# Patient Record
Sex: Female | Born: 1980
Health system: Southern US, Community
[De-identification: ages and names within clinical notes are randomized; demographics above are authoritative.]

## PROBLEM LIST (undated history)

## (undated) DIAGNOSIS — L680 Hirsutism: Secondary | ICD-10-CM

## (undated) DIAGNOSIS — D219 Benign neoplasm of connective and other soft tissue, unspecified: Secondary | ICD-10-CM

## (undated) DIAGNOSIS — I10 Essential (primary) hypertension: Secondary | ICD-10-CM

## (undated) DIAGNOSIS — G510 Bell's palsy: Secondary | ICD-10-CM

## (undated) DIAGNOSIS — E669 Obesity, unspecified: Secondary | ICD-10-CM

## (undated) DIAGNOSIS — E282 Polycystic ovarian syndrome: Secondary | ICD-10-CM

## (undated) DIAGNOSIS — E221 Hyperprolactinemia: Secondary | ICD-10-CM

## (undated) DIAGNOSIS — O24419 Gestational diabetes mellitus in pregnancy, unspecified control: Secondary | ICD-10-CM

## (undated) HISTORY — DX: Hyperprolactinemia: E22.1

## (undated) HISTORY — DX: Hirsutism: L68.0

## (undated) HISTORY — DX: Benign neoplasm of connective and other soft tissue, unspecified: D21.9

## (undated) HISTORY — DX: Gestational diabetes mellitus in pregnancy, unspecified control: O24.419

## (undated) HISTORY — DX: Bell's palsy: G51.0

## (undated) HISTORY — DX: Obesity, unspecified: E66.9

---

## 1998-11-13 ENCOUNTER — Emergency Department (HOSPITAL_COMMUNITY): Admission: EM | Admit: 1998-11-13 | Discharge: 1998-11-13 | Payer: Self-pay | Admitting: Emergency Medicine

## 1998-11-13 ENCOUNTER — Encounter: Payer: Self-pay | Admitting: Emergency Medicine

## 2006-07-15 ENCOUNTER — Emergency Department (HOSPITAL_COMMUNITY): Admission: EM | Admit: 2006-07-15 | Discharge: 2006-07-15 | Payer: Self-pay | Admitting: Emergency Medicine

## 2006-09-21 ENCOUNTER — Emergency Department (HOSPITAL_COMMUNITY): Admission: EM | Admit: 2006-09-21 | Discharge: 2006-09-21 | Payer: Self-pay | Admitting: Emergency Medicine

## 2008-03-15 HISTORY — PX: COLPOSCOPY: SHX161

## 2008-04-08 ENCOUNTER — Ambulatory Visit (HOSPITAL_COMMUNITY): Admission: RE | Admit: 2008-04-08 | Discharge: 2008-04-08 | Payer: Self-pay | Admitting: Obstetrics and Gynecology

## 2008-04-08 ENCOUNTER — Encounter (INDEPENDENT_AMBULATORY_CARE_PROVIDER_SITE_OTHER): Payer: Self-pay | Admitting: Obstetrics and Gynecology

## 2008-04-08 HISTORY — PX: CERVICAL CONE BIOPSY: SUR198

## 2011-02-13 ENCOUNTER — Emergency Department (HOSPITAL_COMMUNITY)
Admission: EM | Admit: 2011-02-13 | Discharge: 2011-02-14 | Disposition: A | Discharge status: Home / Self Care | Payer: Self-pay | Attending: Emergency Medicine | Admitting: Emergency Medicine

## 2011-02-13 DIAGNOSIS — R300 Dysuria: Secondary | ICD-10-CM | POA: Insufficient documentation

## 2011-02-13 DIAGNOSIS — N390 Urinary tract infection, site not specified: Secondary | ICD-10-CM | POA: Insufficient documentation

## 2011-02-14 LAB — URINALYSIS, ROUTINE W REFLEX MICROSCOPIC
Bilirubin Urine: NEGATIVE
Glucose, UA: NEGATIVE mg/dL
Hgb urine dipstick: NEGATIVE
Ketones, ur: NEGATIVE mg/dL
Nitrite: NEGATIVE
Protein, ur: NEGATIVE mg/dL
Specific Gravity, Urine: 1.023 (ref 1.005–1.030)
Urobilinogen, UA: 1 mg/dL (ref 0.0–1.0)
pH: 8 (ref 5.0–8.0)

## 2011-02-14 LAB — URINE MICROSCOPIC-ADD ON

## 2011-02-14 LAB — POCT PREGNANCY, URINE: Preg Test, Ur: NEGATIVE

## 2011-03-19 NOTE — Op Note (Signed)
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   POSTOPERATIVE DIAGNOSIS:  Abnormal genital cytology probably  endocervical dysplasia.   OPERATION:  Cold-knife conization of the cervix.   SURGEON:  Vanessa P. Haygood, MD   ANESTHESIA:  General orotracheal.   ESTIMATED BLOOD LOSS:  Less than 10 mL.   COMPLICATIONS:  None.   FINDINGS:  There were no Lugol's stain, nonstaining areas on the  exocervix.   PROCEDURE:  The patient was taken to the operating room after  appropriate identification and placed on the operating table.  After the  attainment of adequate general anesthesia, she was placed in the  lithotomy position.  The perineum and vagina were prepped with multiple  layers of Betadine and draped as a sterile field.  A weighted speculum  was placed in the posterior vagina and a paracervical block achieved  with a total of 10 mL of 2% Xylocaine in the 5 and 7 o'clock positions.  The cervix was then infiltrated with a dilute solution of Pitressin.  Anchor sutures were placed at the 3 and 9 o'clock positions and tied  down.  The endocervical canal measured approximately 3 cm.  A cone  shaped specimen including the entire endocervical canal was then excised  and marked at the 12 o'clock position.  The conization bed was then  closed with Sturmdorf sutures and hemostasis noted to be adequate.  A  piece of Gelfoam was placed in the conization bed.  All sutures used  were 0 Vicryl.  All instruments were then removed from the vagina and  the patient awakened from general anesthesia and taken to the  recovery  room in satisfactory condition having tolerated the procedure well with  sponge and instrument counts correct.   SPECIMENS TO PATHOLOGY:  Cold-knife conization.      Hal Morales, M.D.  Electronically Signed     VPH/MEDQ  D:  04/08/2008  T:  04/08/2008  Job:  [id number]

## 2011-08-01 LAB — PREGNANCY, URINE: Preg Test, Ur: NEGATIVE

## 2011-08-01 LAB — CBC
MCHC: 34.9
Platelets: 281
RBC: 4.49
RDW: 12.7

## 2011-10-23 ENCOUNTER — Emergency Department (HOSPITAL_COMMUNITY): Payer: Self-pay

## 2011-10-23 ENCOUNTER — Encounter: Payer: Self-pay | Admitting: *Deleted

## 2011-10-23 ENCOUNTER — Emergency Department (HOSPITAL_COMMUNITY)
Admission: EM | Admit: 2011-10-23 | Discharge: 2011-10-23 | Disposition: A | Discharge status: Home / Self Care | Payer: Self-pay | Attending: Emergency Medicine | Admitting: Emergency Medicine

## 2011-10-23 DIAGNOSIS — M25562 Pain in left knee: Secondary | ICD-10-CM

## 2011-10-23 DIAGNOSIS — M25569 Pain in unspecified knee: Secondary | ICD-10-CM | POA: Insufficient documentation

## 2011-10-23 DIAGNOSIS — Y9269 Other specified industrial and construction area as the place of occurrence of the external cause: Secondary | ICD-10-CM | POA: Insufficient documentation

## 2011-10-23 DIAGNOSIS — W108XXA Fall (on) (from) other stairs and steps, initial encounter: Secondary | ICD-10-CM | POA: Insufficient documentation

## 2011-10-23 MED ORDER — OXYCODONE-ACETAMINOPHEN 5-325 MG PO TABS: 1.0000 | ORAL_TABLET | Freq: Four times a day (QID) | ORAL | Status: AC | PRN

## 2011-10-23 MED ORDER — IBUPROFEN 800 MG PO TABS: 800.0000 mg | ORAL_TABLET | Freq: Three times a day (TID) | ORAL | Status: AC

## 2011-10-23 MED ORDER — OXYCODONE-ACETAMINOPHEN 5-325 MG PO TABS
1.0000 | ORAL_TABLET | Freq: Once | ORAL | Status: AC
  Administered 2011-10-23: 1 via ORAL
  Filled 2011-10-23: qty 1

## 2011-10-23 NOTE — ED Notes (Signed)
Pt in c/o left knee pain and swelling, states she injured it a few months ago, denies new injury

## 2011-10-23 NOTE — ED Notes (Signed)
Patient stable upon discharge.  

## 2011-10-23 NOTE — ED Provider Notes (Signed)
History     CSN: [id number] Arrival date & time: 10/23/2011  5:05 AM   First MD Initiated Contact with Patient 10/23/11 [phone]      Chief Complaint  Patient presents with  . Knee Pain    (Consider location/radiation/quality/duration/timing/severity/associated sxs/prior treatment) Patient is a 30 y.o. female presenting with knee pain. The history is provided by the patient and the spouse.  Knee Pain This is a new problem. The current episode started more than 1 month ago. The problem occurs intermittently. The problem has been waxing and waning. Pertinent negatives include no numbness or weakness. The symptoms are aggravated by bending and walking. She has tried ice, immobilization and NSAIDs for the symptoms. The treatment provided mild relief.   the patient reports that while walking into work 3 months ago, she tripped on steps and fell onto her left knee. She was ambulatory after the fall. She took ibuprofen and the pain resolved. Approximately one month later, the pain returned and has been waxing and waning since that time and has been moderate in severity until last night when it became severe. She has been applying ice intermittently, using ibuprofen, and using a knee brace that she purchased from the store for the last 2 months with intermittent transient relief until last night. There is associated swelling of the knee. There is no associated numbness, weakness, calf tenderness/edema, or bruising.  History reviewed. No pertinent past medical history.  History reviewed. No pertinent past surgical history.  History reviewed. No pertinent family history.  History  Substance Use Topics  . Smoking status: Current Everyday Smoker  . Smokeless tobacco: Not on file  . Alcohol Use: Yes     Review of Systems  Neurological: Negative for weakness and numbness.  10 systems reviewed and are negative for acute change except as noted in the HPI.   Allergies  Review of patient's  allergies indicates no known allergies.  Home Medications  No current outpatient prescriptions on file.  BP 135/92  Pulse 83  Temp(Src) 98.5 F (36.9 C) (Oral)  Resp 18  SpO2 96%  Physical Exam  Nursing note and vitals reviewed. Constitutional: She is oriented to person, place, and time. She appears well-developed and well-nourished. No distress.  HENT:  Head: Normocephalic and atraumatic.  Right Ear: External ear normal.  Left Ear: External ear normal.  Nose: Nose normal.  Mouth/Throat: Oropharynx is clear and moist.  Eyes: Conjunctivae are normal. Pupils are equal, round, and reactive to light.  Neck: Normal range of motion. Neck supple.  Cardiovascular: Normal rate, regular rhythm and intact distal pulses.   Pulmonary/Chest: Effort normal. No respiratory distress.  Abdominal: Soft. She exhibits no distension. There is no tenderness.  Musculoskeletal:       There is pain to palpation of the left knee. Pain is worse over the medial joint line and over the popliteal fossa, is moderate over the lateral joint line and anteriorly. Range of motion is significantly limited by pain in the patient does not allow for passive flexion beyond the 90 mark. There is no bony deformity palpated and no crepitus. Lachman's with firm endpoint. There is pain with both valgus and varus maneuvers. There is visible edema surrounding the left knee. There are no changes to the overlying skin. Strength of left knee flexion and extension is 4/5. There is no pain, decreased range of motion, or weakness to the hip, ankle, foot on the same side. All other joints are without tenderness, edema or decreased ROM.  Neurological: She is alert and oriented to person, place, and time. No cranial nerve deficit. Coordination normal.       Sensation intact to light touch  Skin: Skin is warm and dry. No rash noted.  Psychiatric: Her behavior is normal.    ED Course  Procedures (including critical care time)  Labs  Reviewed - No data to display Dg Knee Complete 4 Views Left  10/23/2011  *RADIOLOGY REPORT*  Clinical Data: Left knee pain, recent fall.  LEFT KNEE - COMPLETE 4+ VIEW  Comparison: None.  Findings: No displaced acute fracture or dislocation identified. No aggressive appearing osseous lesion.  Small joint effusion.  IMPRESSION: No acute osseous abnormality. Small joint effusion.  Original Report Authenticated By: Waneta Martins, M.D.     Diagnosis #1: Knee pain, left   MDM  X-ray reviewed and shows a small joint effusion. Based on the patient's exam and continued pain of 3 months after an injury, I do suspect some other form of internal derangement and have advised her of this. She is to followup with orthopedics for further testing and definitive treatment and in the meantime will wear a knee immobilizer and she will be given medication for pain.        [address] [place], [place] 10/23/11 [phone]

## 2011-10-23 NOTE — ED Provider Notes (Signed)
Medical screening examination/treatment/procedure(s) were performed by non-physician practitioner and as supervising physician I was immediately available for consultation/collaboration.   Gwyneth Sprout, MD 10/23/11 2045

## 2012-02-04 ENCOUNTER — Other Ambulatory Visit: Payer: Self-pay | Admitting: Family Medicine

## 2012-02-04 ENCOUNTER — Other Ambulatory Visit (HOSPITAL_COMMUNITY)
Admission: RE | Admit: 2012-02-04 | Discharge: 2012-02-04 | Disposition: A | Discharge status: Home / Self Care | Payer: Self-pay | Source: Ambulatory Visit | Attending: Family Medicine | Admitting: Family Medicine

## 2012-02-04 DIAGNOSIS — Z01419 Encounter for gynecological examination (general) (routine) without abnormal findings: Secondary | ICD-10-CM | POA: Insufficient documentation

## 2012-07-23 ENCOUNTER — Encounter (HOSPITAL_COMMUNITY): Payer: Self-pay | Admitting: Emergency Medicine

## 2012-07-23 ENCOUNTER — Emergency Department (INDEPENDENT_AMBULATORY_CARE_PROVIDER_SITE_OTHER)
Admission: EM | Admit: 2012-07-23 | Discharge: 2012-07-23 | Disposition: A | Discharge status: Home / Self Care | Payer: 59 | Source: Home / Self Care | Attending: Family Medicine | Admitting: Family Medicine

## 2012-07-23 DIAGNOSIS — I10 Essential (primary) hypertension: Secondary | ICD-10-CM

## 2012-07-23 DIAGNOSIS — H8110 Benign paroxysmal vertigo, unspecified ear: Secondary | ICD-10-CM

## 2012-07-23 HISTORY — DX: Essential (primary) hypertension: I10

## 2012-07-23 HISTORY — DX: Polycystic ovarian syndrome: E28.2

## 2012-07-23 LAB — POCT I-STAT, CHEM 8
HCT: 42 % (ref 36.0–46.0)
Hemoglobin: 14.3 g/dL (ref 12.0–15.0)
Potassium: 4.5 mEq/L (ref 3.5–5.1)
Sodium: 139 mEq/L (ref 135–145)

## 2012-07-23 LAB — POCT URINALYSIS DIP (DEVICE)
Bilirubin Urine: NEGATIVE
Glucose, UA: NEGATIVE mg/dL
Leukocytes, UA: NEGATIVE
Nitrite: NEGATIVE

## 2012-07-23 MED ORDER — MECLIZINE HCL 50 MG PO TABS: 50.0000 mg | ORAL_TABLET | Freq: Three times a day (TID) | ORAL | Status: DC | PRN

## 2012-07-23 NOTE — ED Notes (Signed)
Patient has multiple complaints: reports upper thigh pain for 2 days. Unknown injury.  Patient also has c/o lightheaded/dizziness episode while at work today.  Reports only one episode.  Patient concerned blood pressure is up secondary to stress in her life and this is why she felt like she did at work.

## 2012-07-23 NOTE — ED Provider Notes (Signed)
History     CSN: [id number]  Arrival date & time 07/23/12  1910   First MD Initiated Contact with Patient 07/23/12 1922      Chief Complaint  Patient presents with  . Dizziness    (Consider location/radiation/quality/duration/timing/severity/associated sxs/prior treatment) HPI Comments: 31 year old female with history of hypertension and polycystic ovarian syndrome. Here complaining of a brief episode of dizziness while at work this morning. States that she was sitting bending forward over her desk chair when she started to feel dizzy/lightheaded and nauseous, states that she also felt hot at the time. Episode lasted a few seconds with nausea resolving but she continued to feel "woozy" and decided to come to the urgent care to be checked. She denies any chest pain or palpitations. Denies LOC or syncope. She states she's been stressed at work lately. Has not had similar episodes in the past. Denies fever or chills. Denies dysuria or hematuria. Patient also reports she's had 2 days with right upper thigh muscle cramps and she's taking Tylenol for it with some improvement. Denies known trauma. She thinks that she had normal thyroid tests done in the past as a workup for her polycystic ovarian syndrome. Patient states that her GYN noticed elevated blood pressure and asked her to check her blood pressure regularly and recommended salt intake reduction and weight loss but has not been started on a blood pressure medication. Patient does not have a primary care provider apart from her Gyn. Patient reports being currently asymptomatic as all her symptoms resolved when she was in the waiting area.   Past Medical History  Diagnosis Date  . Hypertension   . PCOS (polycystic ovarian syndrome)     History reviewed. No pertinent past surgical history.  No family history on file.  History  Substance Use Topics  . Smoking status: Current Every Day Smoker  . Smokeless tobacco: Not on file  . Alcohol  Use: Yes    OB History    Grav Para Term Preterm Abortions TAB SAB Ect Mult Living                  Review of Systems  Constitutional: Negative for fever, chills, diaphoresis, appetite change and fatigue.  HENT: Negative for ear pain, congestion, sore throat, rhinorrhea and tinnitus.   Respiratory: Negative for cough.   Gastrointestinal: Positive for nausea. Negative for vomiting, abdominal pain and diarrhea.  Genitourinary: Negative for dysuria, frequency, hematuria, flank pain and pelvic pain.  Musculoskeletal: Positive for myalgias.  Skin: Negative for rash.  Neurological: Positive for dizziness. Negative for tremors, seizures, syncope, weakness, numbness and headaches.    Allergies  Review of patient's allergies indicates no known allergies.  Home Medications   Current Outpatient Rx  Name Route Sig Dispense Refill  . ACETAMINOPHEN 325 MG PO TABS Oral Take 650 mg by mouth every 6 (six) hours as needed.    . MECLIZINE HCL 50 MG PO TABS Oral Take 1 tablet (50 mg total) by mouth 3 (three) times daily as needed for dizziness or nausea. 20 tablet 0    BP 159/100  Pulse 70  Temp 98.1 F (36.7 C) (Oral)  Resp 16  SpO2 100%  LMP 06/11/2012  Physical Exam  Nursing note and vitals reviewed. Constitutional: She is oriented to person, place, and time. She appears well-developed and well-nourished. No distress.  HENT:  Head: Normocephalic and atraumatic.  Right Ear: External ear normal.  Left Ear: External ear normal.  Nose: Nose normal.  Mouth/Throat: Oropharynx  is clear and moist. No oropharyngeal exudate.  Eyes: Conjunctivae normal and EOM are normal. Pupils are equal, round, and reactive to light. Right eye exhibits no discharge. Left eye exhibits no discharge. No scleral icterus.  Neck: Neck supple. No thyromegaly present.  Cardiovascular: Normal rate, regular rhythm and normal heart sounds.  Exam reveals no gallop and no friction rub.   No murmur  heard. Pulmonary/Chest: Effort normal and breath sounds normal. No respiratory distress. She has no wheezes. She has no rales. She exhibits no tenderness.  Abdominal: Soft. Bowel sounds are normal. There is no tenderness.       No CVT  Lymphadenopathy:    She has no cervical adenopathy.  Neurological: She is alert and oriented to person, place, and time.  Skin: Skin is warm. No rash noted. She is not diaphoretic.    ED Course  Procedures (including critical care time)  Labs Reviewed  POCT I-STAT, CHEM 8 - Abnormal; Notable for the following:    Calcium, Ion 1.11 (*)     All other components within normal limits  POCT URINALYSIS DIP (DEVICE)  POCT PREGNANCY, URINE   No results found.   1. Benign positional vertigo   2. Hypertension       MDM  31 year old female obese with history of polycystic ovarian syndrome here complaining of a brief episode of dizziness and nausea. Currently asymptomatic. Pregnancy test is negative, normal urinalysis. Normal electrolytes, creatinine and glucose. Clinically well with normal cardiovascular examination. Possible positional vertigo versus symptoms related to hypertension as BP 159/100. Encouraged smoking cessation. Prescribed meclizine. Primary care provider list was given to patient prior to discharge. Asked to go to the emergency department if worsening symptoms despite following treatment.        Sharin Grave, MD 07/25/12 1035

## 2013-01-27 ENCOUNTER — Encounter: Payer: Self-pay | Admitting: Family Medicine

## 2013-01-27 ENCOUNTER — Ambulatory Visit (INDEPENDENT_AMBULATORY_CARE_PROVIDER_SITE_OTHER): Payer: No Typology Code available for payment source | Admitting: Family Medicine

## 2013-01-27 VITALS — BP 152/106 | HR 72 | Ht 67.0 in | Wt 258.0 lb

## 2013-01-27 DIAGNOSIS — E282 Polycystic ovarian syndrome: Secondary | ICD-10-CM

## 2013-01-27 DIAGNOSIS — I10 Essential (primary) hypertension: Secondary | ICD-10-CM

## 2013-01-27 LAB — LIPID PANEL
LDL Cholesterol: 116 mg/dL — ABNORMAL HIGH (ref 0–99)
Total CHOL/HDL Ratio: 5.2 Ratio

## 2013-01-27 LAB — COMPREHENSIVE METABOLIC PANEL
ALT: 17 U/L (ref 0–35)
AST: 15 U/L (ref 0–37)
Albumin: 4.3 g/dL (ref 3.5–5.2)
Calcium: 9.5 mg/dL (ref 8.4–10.5)
Chloride: 105 mEq/L (ref 96–112)
Creat: 0.73 mg/dL (ref 0.50–1.10)
Potassium: 4.3 mEq/L (ref 3.5–5.3)

## 2013-01-27 NOTE — Progress Notes (Signed)
Chief Complaint  Patient presents with  . Establish Care    patient wants to establish care and states that she has been having elevated bp reading over the last 6 months.   Patient reports that her BP's have been running high for about 6 months.  She checks it at her boyfriend's house, and it usually runs 120's-135/86-90, but runs higher when at the doctor.   She denies any headaches (rare, mild). Denies chest pain, palpitations, shortness of breath, edema.  Does yoga twice a week, but no regular aerobic activity. +fast food, +salted nuts, frozen dinners.  Past Medical History  Diagnosis Date  . Hypertension   . PCOS (polycystic ovarian syndrome)     Dr. Dion Body (GYN)    Past Surgical History  Procedure Laterality Date  . Cervical cone biopsy      History   Social History  . Marital Status: Single    Spouse Name: N/A    Number of Children: N/A  . Years of Education: N/A   Occupational History  . 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 Smoking status: Former Smoker -- 0.50 packs/day for 15 years    Types: Cigarettes    Quit date: 11/05/2011  . Smokeless tobacco: Never Used  . Alcohol Use: Yes     Comment: 3 drinks per week.  . Drug Use: Yes    Special: Marijuana     Comment: marijuana, 2 x per week.  Marland Kitchen Sexually Active: Yes -- Female partner(s)   Other Topics Concern  . Not on file   Social History Narrative   Lives with her mother.  Passive tobacco exposure from her boyfriend.  She is in school for medical assisting    Family History  Problem Relation Age of Onset  . Hypertension Father   . Hyperlipidemia Father   . Cerebral aneurysm Sister   . Diabetes Maternal Aunt   . Multiple myeloma Maternal Grandmother     Current outpatient prescriptions:metFORMIN (GLUCOPHAGE) 500 MG tablet, Take 500 mg by mouth 2 (two) times daily with a meal., Disp: , Rfl:   No Known Allergies  ROS:  Denies fevers, URI symptoms, cough, allergies, rashes,  bleeding, bruising, depression/anxiety. Denies urinary complaints.  Menses are irregular (painful)--had been more regular since starting metformin, but skipped 2 months from January to March.  PHYSICAL EXAM: BP 158/104  Pulse 72  Ht 5\' 7"  (1.702 m)  Wt 258 lb (117.028 kg)  BMI 40.4 kg/m2  LMP 01/16/2013 152/106 on repeat by MD Well developed, pleasant, obese female in no distress HEENT:  PERRL, EOMI, conjunctiva clear.  OP clear Neck: no lymphadenopathy, thyromegaly or carotid bruit Heart: regular rate and rhythm without murmur Lungs: clear bilaterally Abdomen: soft, nontender, no abdominal bruit, no mass Extremities: no edema, 2+ pulse Neuro: alert and oriented.  Cranial nerves intact.  Normal gait, strength Psych: normal mood, affect, hygiene and grooming Skin: no rash  ASSESSMENT/PLAN:  Essential hypertension, benign - ?white coat hypertension, vs hypertension (normal BP's per home monitor, per pt).  need to verify accuracy of home monitor - Plan: Comprehensive metabolic panel, Lipid panel  PCOS (polycystic ovarian syndrome)  Discussed elevated blood pressure in detail.   Recommended 30-60 minutes of cardio daily, weight loss, reviewed low sodium diet. Check BP's elsewhere, bring list and monitor to next visit.  If BP's persistently elevated, and we decide to treat for HTN, will also need EKG at next visit  c-met and lipid today  Use contraception--discussed need for condom use to prevent unwanted pregnancy.  Discussed that metformin may be causing her to ovulate more regularly, and increase her risk for pregnancy.  F/u 6 weeks

## 2013-01-27 NOTE — Patient Instructions (Addendum)
Sodium-Controlled Diet Sodium is a mineral. It is found in many foods. Sodium may be found naturally or added during the making of a food. The most common form of sodium is salt, which is made up of sodium and chloride. Reducing your sodium intake involves changing your eating habits. The following guidelines will help you reduce the sodium in your diet:  Stop using the salt shaker.  Use salt sparingly in cooking and baking.  Substitute with sodium-free seasonings and spices.  Do not use a salt substitute (potassium chloride) without your caregiver's permission.  Include a variety of fresh, unprocessed foods in your diet.  Limit the use of processed and convenience foods that are high in sodium. USE THE FOLLOWING FOODS SPARINGLY: Breads/Starches  Commercial bread stuffing, commercial pancake or waffle mixes, coating mixes. Waffles. Croutons. Prepared (boxed or frozen) potato, rice, or noodle mixes that contain salt or sodium. Salted [place] fries or hash browns. Salted popcorn, breads, crackers, chips, or snack foods. Vegetables  Vegetables canned with salt or prepared in cream, butter, or cheese sauces. Sauerkraut. Tomato or vegetable juices canned with salt.  Fresh vegetables are allowed if rinsed thoroughly. Fruit  Fruit is okay to eat. Meat and Meat Substitutes  Salted or smoked meats, such as bacon or Canadian bacon, chipped or corned beef, hot dogs, salt pork, luncheon meats, pastrami, ham, or sausage. Canned or smoked fish, poultry, or meat. Processed cheese or cheese spreads, blue or Roquefort cheese. Battered or frozen fish products. Prepared spaghetti sauce. Baked beans. Reuben sandwiches. Salted nuts. Caviar. Milk  Limit buttermilk to 1 cup per week. Soups and Combination Foods  Bouillon cubes, canned or dried soups, broth, consomm. Convenience (frozen or packaged) dinners with more than 600 mg sodium. Pot pies, pizza, Asian food, fast food cheeseburgers, and specialty  sandwiches. Desserts and Sweets  Regular (salted) desserts, pie, commercial fruit snack pies, commercial snack cakes, canned puddings.  Eat desserts and sweets in moderation. Fats and Oils  Gravy mixes or canned gravy. No more than 1 to 2 tbs of salad dressing. Chip dips.  Eat fats and oils in moderation. Beverages  See those listed under the vegetables and milk groups. Condiments  Ketchup, mustard, meat sauces, salsa, regular (salted) and lite soy sauce or mustard. Dill pickles, olives, meat tenderizer. Prepared horseradish or pickle relish. Dutch-processed cocoa. Baking powder or baking soda used medicinally. Worcestershire sauce. "Light" salt. Salt substitute, unless approved by your caregiver. Document Released: 04/12/2002 Document Revised: 01/13/2012 Document Reviewed: 11/13/2009 [facility] Patient Information 2013 [place], [place].  It is recommended that you get at least 30 minutes of aerobic exercise at least 5 days/week (for weight loss, you may need as much as 60-90 minutes). This can be any activity that gets your heart rate up. This can be divided in 10-15 minute intervals if needed, but try and build up your endurance at least once a week.  Weight bearing exercise is also recommended twice weekly.  Monitor your blood pressure regularly--using cuff at your boyfriend's house, but also at the pharmacy.  Bring the BP monitor and your list of blood pressures to your next visit.  Hypertension As your heart beats, it forces blood through your arteries. This force is your blood pressure. If the pressure is too high, it is called hypertension (HTN) or high blood pressure. HTN is dangerous because you may have it and not know it. High blood pressure may mean that your heart has to work harder to pump blood. Your arteries may be narrow  or stiff. The extra work puts you at risk for heart disease, stroke, and other problems.  Blood pressure consists of two numbers, a higher number over a  lower, 110/72, for example. It is stated as "110 over 72." The ideal is below 120 for the top number (systolic) and under 80 for the bottom (diastolic). Write down your blood pressure today. You should pay close attention to your blood pressure if you have certain conditions such as:  Heart failure.  Prior heart attack.  Diabetes  Chronic kidney disease.  Prior stroke.  Multiple risk factors for heart disease. To see if you have HTN, your blood pressure should be measured while you are seated with your arm held at the level of the heart. It should be measured at least twice. A one-time elevated blood pressure reading (especially in the Emergency Department) does not mean that you need treatment. There may be conditions in which the blood pressure is different between your right and left arms. It is important to see your caregiver soon for a recheck. Most people have essential hypertension which means that there is not a specific cause. This type of high blood pressure may be lowered by changing lifestyle factors such as:  Stress.  Smoking.  Lack of exercise.  Excessive weight.  Drug/tobacco/alcohol use.  Eating less salt. Most people do not have symptoms from high blood pressure until it has caused damage to the body. Effective treatment can often prevent, delay or reduce that damage. TREATMENT  When a cause has been identified, treatment for high blood pressure is directed at the cause. There are a large number of medications to treat HTN. These fall into several categories, and your caregiver will help you select the medicines that are best for you. Medications may have side effects. You should review side effects with your caregiver. If your blood pressure stays high after you have made lifestyle changes or started on medicines,   Your medication(s) may need to be changed.  Other problems may need to be addressed.  Be certain you understand your prescriptions, and know how and  when to take your medicine.  Be sure to follow up with your caregiver within the time frame advised (usually within two weeks) to have your blood pressure rechecked and to review your medications.  If you are taking more than one medicine to lower your blood pressure, make sure you know how and at what times they should be taken. Taking two medicines at the same time can result in blood pressure that is too low. SEEK IMMEDIATE MEDICAL CARE IF:  You develop a severe headache, blurred or changing vision, or confusion.  You have unusual weakness or numbness, or a faint feeling.  You have severe chest or abdominal pain, vomiting, or breathing problems. MAKE SURE YOU:   Understand these instructions.  Will watch your condition.  Will get help right away if you are not doing well or get worse. Document Released: 10/21/2005 Document Revised: 01/13/2012 Document Reviewed: 06/10/2008 [facility] Patient Information 2013 [place], [place].

## 2013-01-28 ENCOUNTER — Encounter: Payer: Self-pay | Admitting: Family Medicine

## 2013-03-02 ENCOUNTER — Encounter: Payer: Self-pay | Admitting: Internal Medicine

## 2013-03-10 ENCOUNTER — Ambulatory Visit: Payer: No Typology Code available for payment source | Admitting: Family Medicine

## 2013-03-15 ENCOUNTER — Encounter: Payer: Self-pay | Admitting: Family Medicine

## 2013-03-15 ENCOUNTER — Ambulatory Visit (INDEPENDENT_AMBULATORY_CARE_PROVIDER_SITE_OTHER): Payer: No Typology Code available for payment source | Admitting: Family Medicine

## 2013-03-15 VITALS — BP 140/98 | HR 72 | Ht 67.0 in | Wt 250.0 lb

## 2013-03-15 DIAGNOSIS — I10 Essential (primary) hypertension: Secondary | ICD-10-CM

## 2013-03-15 NOTE — Progress Notes (Signed)
Chief Complaint  Patient presents with  . Hypertension    6 week hypertension follow up.   Patient presents to follow up on her blood pressure.  BP's at home are running 135-146/90-96.  She forgot to bring her monitor to visit today to verify the accuracy, but BP's are running higher at home, more consistent with numbers here.  Denies any headaches, edema, dizziness. She cut back on her salt intake, started doing more yoga and walking more.  She has lost 8 pounds since her last visit.  Past Medical History  Diagnosis Date  . Hypertension   . PCOS (polycystic ovarian syndrome)     Dr. Dion Body (GYN)   Past Surgical History  Procedure Laterality Date  . Cervical cone biopsy     History   Social History  . Marital Status: Single    Spouse Name: N/A    Number of Children: N/A  . Years of Education: N/A   Occupational History  . 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 Smoking status: Former Smoker -- 0.50 packs/day for 15 years    Types: Cigarettes    Quit date: 11/05/2011  . Smokeless tobacco: Never Used  . Alcohol Use: Yes     Comment: 3 drinks per week.  . Drug Use: Yes    Special: Marijuana     Comment: marijuana, 2 x per week.  Marland Kitchen Sexually Active: Yes -- Female partner(s)   Other Topics Concern  . Not on file   Social History Narrative   Lives with her mother.  Passive tobacco exposure from her boyfriend.  She is in school for medical assisting   Current Outpatient Prescriptions on File Prior to Visit  Medication Sig Dispense Refill  . metFORMIN (GLUCOPHAGE) 500 MG tablet Take 500 mg by mouth 2 (two) times daily with a meal.       No current facility-administered medications on file prior to visit.   No Known Allergies  ROS:  Denies headaches, dizziness, chest pain, edema, fevers, URI symptoms, shortness of breath, bleeding, rashes or other concerns.  PHYSICAL EXAM: BP 140/98  Pulse 72  Ht 5\' 7"  (1.702 m)  Wt 250 lb (113.399 kg)  BMI  39.15 kg/m2  LMP 01/16/2013 Well developed, pleasant female in no distress Neck: no lymphadenopathy, thyromegaly or mass Heart: regular rate and rhythm without murmur Lungs: clear bilaterally Extremities: no edema  EKG: NSR, normal, no evidence of LVH or other abnormality  Lab Results  Component Value Date   CHOL 167 01/27/2013   HDL 32* 01/27/2013   LDLCALC 116* 01/27/2013   TRIG 94 01/27/2013   CHOLHDL 5.2 01/27/2013     Chemistry      Component Value Date/Time   NA 140 01/27/2013 1128   K 4.3 01/27/2013 1128   CL 105 01/27/2013 1128   CO2 27 01/27/2013 1128   BUN 11 01/27/2013 1128   CREATININE 0.73 01/27/2013 1128   CREATININE 0.90 07/23/2012 2136      Component Value Date/Time   CALCIUM 9.5 01/27/2013 1128   ALKPHOS 62 01/27/2013 1128   AST 15 01/27/2013 1128   ALT 17 01/27/2013 1128   BILITOT 0.4 01/27/2013 1128       ASSESSMENT/PLAN:  Elevated BP/HTN--BP is improved, although remains high.  She has done very well with weight loss and dietary changes.  Given lack of evidence of long-term HTN on EKG, will give another 3 months of conservative management--low sodium diet, weight loss,  daily exercise.  Will need to start meds if BP's remain elevated, if she becomes symptomatic.  Labs reviewed with pt--low HDL, elevated chol/HDL ratio.  Lowfat diet, plan to recheck at next visit.   f/u 3 months with list of BP's; plan to repeat lipids (come fasting)  F/u sooner if headaches, chest pain, BP's running higher

## 2013-03-15 NOTE — Patient Instructions (Signed)
Continue low sodium diet, daily exercise and weight loss. Continue to monitor your BP and keep a record. Return in 3 months.  If BP's remain >140/90, medication will likely be started.  Return fasting for your next visit so we can recheck cholesterol--daily exercise and low cholesterol diet.

## 2013-06-21 ENCOUNTER — Ambulatory Visit: Payer: No Typology Code available for payment source | Admitting: Family Medicine

## 2015-06-22 ENCOUNTER — Encounter: Payer: Self-pay | Admitting: Internal Medicine

## 2015-06-22 ENCOUNTER — Ambulatory Visit (INDEPENDENT_AMBULATORY_CARE_PROVIDER_SITE_OTHER): Payer: 59 | Admitting: Internal Medicine

## 2015-06-22 VITALS — BP 110/76 | HR 110 | Temp 98.5°F | Resp 14 | Ht 67.0 in | Wt 219.4 lb

## 2015-06-22 DIAGNOSIS — E669 Obesity, unspecified: Secondary | ICD-10-CM | POA: Diagnosis not present

## 2015-06-22 DIAGNOSIS — I10 Essential (primary) hypertension: Secondary | ICD-10-CM

## 2015-06-22 MED ORDER — LABETALOL HCL 100 MG PO TABS: 100.0000 mg | ORAL_TABLET | Freq: Two times a day (BID) | ORAL | Status: DC

## 2015-06-22 NOTE — Assessment & Plan Note (Signed)
Will switch olmesartan/hctz to labetalol and have her check at home. If BP readings elevated can titrate therapy. According to ACOG guidelines she would be able to continue on the HCTZ if needed for BP control since she was on prior to pregnancy.

## 2015-06-22 NOTE — Patient Instructions (Signed)
We will have you stop taking the benicar/hctz and start taking labetalol. Take 1 pill twice a day for the blood pressure. Give it a week or two to get into your body and then check at a drugstore or grocery store.   We want the blood pressure to be <140 for the top number and <90 for the bottom number. If it starts being high call the office and come back for a nurse visit to get it checked.   If you are doing well we will see you back in about 3-6 months to check on the blood pressure. If you have any problems or questions sooner please feel free to call the office.  Exercise to Stay Healthy Exercise helps you become and stay healthy. EXERCISE IDEAS AND TIPS Choose exercises that:  You enjoy.  Fit into your day. You do not need to exercise really hard to be healthy. You can do exercises at a slow or medium level and stay healthy. You can:  Stretch before and after working out.  Try yoga, Pilates, or tai chi.  Lift weights.  Walk fast, swim, jog, run, climb stairs, bicycle, dance, or rollerskate.  Take aerobic classes. Exercises that burn about 150 calories:  Running 1  miles in 15 minutes.  Playing volleyball for 45 to 60 minutes.  Washing and waxing a car for 45 to 60 minutes.  Playing touch football for 45 minutes.  Walking 1  miles in 35 minutes.  Pushing a stroller 1  miles in 30 minutes.  Playing basketball for 30 minutes.  Raking leaves for 30 minutes.  Bicycling 5 miles in 30 minutes.  Walking 2 miles in 30 minutes.  Dancing for 30 minutes.  Shoveling snow for 15 minutes.  Swimming laps for 20 minutes.  Walking up stairs for 15 minutes.  Bicycling 4 miles in 15 minutes.  Gardening for 30 to 45 minutes.  Jumping rope for 15 minutes.  Washing windows or floors for 45 to 60 minutes. Document Released: 11/23/2010 Document Revised: 01/13/2012 Document Reviewed: 11/23/2010 [employer] Patient Information 2015 [place], [place]. This information is not  intended to replace advice given to you by your health care provider. Make sure you discuss any questions you have with your health care provider.

## 2015-06-22 NOTE — Progress Notes (Signed)
   Subjective:    Patient ID: Janet Bowen, female    DOB: Jul 12, 1981, 34 y.o.   MRN: [id number]  HPI The patient is a 34 YO female coming in for hypertension. She has been treated for this for several years (<5) and has been stable on regimen of olmesartan/hctz. However she is now contemplating trying to get pregnant and needs an alternative regimen. No side effects and no known complications. No other complaints. No headaches, SOB, chest pain.   PMH, [facility], social history reviewed and updated.   Review of Systems  Constitutional: Negative for fever, activity change, appetite change and unexpected weight change.  HENT: Negative.   Eyes: Negative.   Respiratory: Negative for cough, chest tightness, shortness of breath and wheezing.   Cardiovascular: Negative for chest pain, palpitations and leg swelling.  Gastrointestinal: Negative for nausea, abdominal pain, diarrhea, constipation and abdominal distention.  Musculoskeletal: Negative.   Skin: Negative.   Neurological: Negative.   Psychiatric/Behavioral: Negative.       Objective:   Physical Exam  Constitutional: She is oriented to person, place, and time. She appears well-developed and well-nourished.  Overweight  HENT:  Head: Normocephalic and atraumatic.  Eyes: EOM are normal.  Neck: Normal range of motion.  Cardiovascular: Normal rate and regular rhythm.   Pulmonary/Chest: Effort normal and breath sounds normal. No respiratory distress. She has no wheezes. She has no rales.  Abdominal: Soft. Bowel sounds are normal. She exhibits no distension. There is no tenderness. There is no rebound.  Musculoskeletal: She exhibits no edema.  Neurological: She is alert and oriented to person, place, and time.  Skin: Skin is warm and dry.  Psychiatric: She has a normal mood and affect.   Filed Vitals:   06/22/15 1600  BP: 110/76  Pulse: 110  Temp: 98.5 F (36.9 C)  TempSrc: Oral  Resp: 14  Height: 5\' 7"  (1.702 m)  Weight: 219 lb  6.4 oz (99.519 kg)  SpO2: 98%      Assessment & Plan:

## 2015-06-22 NOTE — Assessment & Plan Note (Signed)
Talked to her about exercising regularly for the health of her and especially since she is contemplating getting pregnant. She has had sugar checks at work and normal. Will check labs at next visit.

## 2015-06-22 NOTE — Progress Notes (Signed)
Pre visit review using our clinic review tool, if applicable. No additional management support is needed unless otherwise documented below in the visit note. 

## 2015-06-29 ENCOUNTER — Telehealth: Payer: Self-pay | Admitting: Internal Medicine

## 2015-06-29 NOTE — Telephone Encounter (Signed)
Received records from [facility] forwarded 21 pages to Dr. Vertell Novak 06/29/15 fbg.

## 2015-07-25 ENCOUNTER — Telehealth: Payer: Self-pay | Admitting: Internal Medicine

## 2015-07-25 NOTE — Telephone Encounter (Signed)
Rec'd records from [facility] [place] 5 page's to [employer]

## 2015-08-29 ENCOUNTER — Other Ambulatory Visit: Payer: Self-pay | Admitting: Obstetrics and Gynecology

## 2015-08-29 DIAGNOSIS — E221 Hyperprolactinemia: Secondary | ICD-10-CM

## 2015-09-11 ENCOUNTER — Ambulatory Visit
Admission: RE | Admit: 2015-09-11 | Discharge: 2015-09-11 | Disposition: A | Discharge status: Home / Self Care | Payer: 59 | Source: Ambulatory Visit | Attending: Obstetrics and Gynecology | Admitting: Obstetrics and Gynecology

## 2015-09-11 DIAGNOSIS — E221 Hyperprolactinemia: Secondary | ICD-10-CM

## 2015-09-11 MED ORDER — GADOBENATE DIMEGLUMINE 529 MG/ML IV SOLN
10.0000 mL | Freq: Once | INTRAVENOUS | Status: AC | PRN
  Administered 2015-09-11: 10 mL via INTRAVENOUS

## 2015-10-12 ENCOUNTER — Encounter: Payer: Self-pay | Admitting: Neurology

## 2015-10-12 ENCOUNTER — Encounter: Payer: Self-pay | Admitting: *Deleted

## 2015-10-12 ENCOUNTER — Ambulatory Visit (INDEPENDENT_AMBULATORY_CARE_PROVIDER_SITE_OTHER): Payer: 59 | Admitting: Neurology

## 2015-10-12 VITALS — BP 145/100 | HR 75 | Ht 67.0 in | Wt 228.8 lb

## 2015-10-12 DIAGNOSIS — R930 Abnormal findings on diagnostic imaging of skull and head, not elsewhere classified: Secondary | ICD-10-CM

## 2015-10-12 DIAGNOSIS — F17200 Nicotine dependence, unspecified, uncomplicated: Secondary | ICD-10-CM

## 2015-10-12 DIAGNOSIS — R9082 White matter disease, unspecified: Secondary | ICD-10-CM

## 2015-10-12 DIAGNOSIS — I10 Essential (primary) hypertension: Secondary | ICD-10-CM

## 2015-10-12 DIAGNOSIS — Z720 Tobacco use: Secondary | ICD-10-CM | POA: Diagnosis not present

## 2015-10-12 DIAGNOSIS — E669 Obesity, unspecified: Secondary | ICD-10-CM

## 2015-10-12 DIAGNOSIS — R739 Hyperglycemia, unspecified: Secondary | ICD-10-CM | POA: Diagnosis not present

## 2015-10-12 NOTE — Patient Instructions (Signed)

## 2015-10-12 NOTE — Progress Notes (Signed)
[facility]    Provider:  Dr Jaynee Eagles Referring Provider: Hoyt Koch, * Primary Care Physician:  Hoyt Koch, MD  CC:  Abnormal white matter changes on MRI  HPI:  Janet Bowen is a 34 y.o. female here as a referral from Dr. Sharlet Salina for incidental white matter changes found on MRi of the brain. Past medical history includes hyperprolactinemia, obesity, primary infertility, hypertensive disorder, polycystic ovaries.  An MRi was ordered due to prolactinemia and a microadenoma was found. She has high blood pressure, is obese, hyperglycemia, and is a smoker. No inciting events.  No diabetes, no cholesterol. No other vascular risk factors associated. No weakness, no paresthesias, no vision changes, no dysarthria, no dysphagia, no urinary retention, no gait dysfunction, no spasticity. No focal neurologic symptoms. No history of neurologic symptoms. No headaches. No history of infections, meningitis, heart disease. Patient has been healthy. Started smoking at 70, stopped at 32 and smoked 1 pack a week.    Reviewed notes, labs and imaging from outside physicians, which showed:  MRI of the brain with and without (personally reviewed images and agree with the following): Subtle asymmetry of the pituitary on the right side. Query 4 mm pituitary microadenoma on the right corresponding to the clinical history of hyperprolactinemia. No compression of the optic chiasm or invasion of the cavernous sinus.  Multiple small white matter hyperintensities bilaterally. This may be related to chronic microvascular ischemia. Correlate with risk factors for such. Other possibilities would include migraine headache, vasculitis and less likely demyelinating disease.  Review of Systems: Patient complains of symptoms per HPI as well as the following symptoms: No CP, No SOB. Pertinent negatives per HPI. All others negative.   Social History   Social History  . Marital Status:  Single    Spouse Name: N/A  . Number of Children: 0  . Years of Education: 16   Occupational History  . 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 Smoking status: Current Every Day Smoker -- 0.50 packs/day for 15 years    Types: Cigarettes    Last Attempt to Quit: 11/05/2011  . Smokeless tobacco: Never Used  . Alcohol Use: No  . Drug Use: Yes    Special: Marijuana     Comment: marijuana, 2 x per week.  Marland Kitchen Sexual Activity:    Partners: Male   Other Topics Concern  . Not on file   Social History Narrative   Lives with her mother.  Passive tobacco exposure from her boyfriend.  She is in school for medical assisting   Caffeine use: quit October 2016        Family History  Problem Relation Age of Onset  . Hypertension Father   . Hyperlipidemia Father   . Cerebral aneurysm Sister   . Diabetes Maternal Aunt   . Multiple myeloma Maternal Grandmother   . Stroke Neg Hx     Past Medical History  Diagnosis Date  . Hypertension   . PCOS (polycystic ovarian syndrome)     Dr. Simona Huh (GYN)  . Hyperprolactinemia ([place])   . Obesity   . Hirsutism     Past Surgical History  Procedure Laterality Date  . Cervical cone biopsy  04-08-2008  . Colposcopy  03-15-2008    Current Outpatient Prescriptions  Medication Sig Dispense Refill  . bromocriptine (PARLODEL) 2.5 MG tablet Take 2.5 mg by mouth daily.   1  . labetalol (NORMODYNE) 100 MG tablet Take 1 tablet (  100 mg total) by mouth 2 (two) times daily. 60 tablet 6  . Prenatal Vit-Fe Fumarate-FA (PRENATABS FA) TABS TK 1 T PO QD  12   No current facility-administered medications for this visit.    Allergies as of 10/12/2015  . (No Known Allergies)    Vitals: BP 145/100 mmHg  Pulse 75  Ht $R'5\' 7"'cQ$  (1.702 m)  Wt 228 lb 12.8 oz (103.783 kg)  BMI 35.83 kg/m2 Last Weight:  Wt Readings from Last 1 Encounters:  10/12/15 228 lb 12.8 oz (103.783 kg)   Last Height:   Ht Readings from Last 1 Encounters:  10/12/15  $RemoveB'5\' 7"'aLlKNzAm$  (1.702 m)         Assessment/Plan:  34 year old female with nonspecific white matter changes and MRI of the brain most likely chronic microvascular ischemia. She has vascular risk factors including high blood pressure current cigarette smoking, obesity and hyperglycemia.   Needs to follow closely with primary care for management of vascular risk factors such as HTN, obesity, cholesterol and others. Discussed that managing these vascular risk factors will likely prevent more chronic ischemic changes in the brain. Hyperglycemia: Pending hemoglobin A1c and needs to follow with primary care for management if elevated Smoking: Highly encouraged smoking cessation   Sarina Ill, MD  [facility] [address] [place], [place] [address]  Phone [phone] Fax [phone]

## 2015-10-13 ENCOUNTER — Encounter: Payer: Self-pay | Admitting: Neurology

## 2015-10-13 DIAGNOSIS — F17200 Nicotine dependence, unspecified, uncomplicated: Secondary | ICD-10-CM | POA: Insufficient documentation

## 2015-10-13 DIAGNOSIS — R739 Hyperglycemia, unspecified: Secondary | ICD-10-CM | POA: Insufficient documentation

## 2015-12-25 ENCOUNTER — Ambulatory Visit (INDEPENDENT_AMBULATORY_CARE_PROVIDER_SITE_OTHER): Payer: 59 | Admitting: Internal Medicine

## 2015-12-25 ENCOUNTER — Encounter: Payer: Self-pay | Admitting: Internal Medicine

## 2015-12-25 VITALS — BP 140/90 | HR 71 | Temp 98.3°F | Resp 14 | Ht 66.5 in | Wt 222.0 lb

## 2015-12-25 DIAGNOSIS — Z720 Tobacco use: Secondary | ICD-10-CM | POA: Diagnosis not present

## 2015-12-25 DIAGNOSIS — I10 Essential (primary) hypertension: Secondary | ICD-10-CM | POA: Diagnosis not present

## 2015-12-25 DIAGNOSIS — F17200 Nicotine dependence, unspecified, uncomplicated: Secondary | ICD-10-CM

## 2015-12-25 NOTE — Assessment & Plan Note (Signed)
She is smoking less than last visit and is working on quitting. She has quit once in the past. Does not feel she needs help to quit at this time.

## 2015-12-25 NOTE — Assessment & Plan Note (Signed)
Initial high with recheck close to normal. She will maintain labetalol due to other home readings and readings at her ob/gyn normal. She will continue to work on weight loss and regular exercise to help.

## 2015-12-25 NOTE — Patient Instructions (Signed)
We will leave the medicine the same today.   Keep up the good work with keeping up the exercise since it does help with the sugars and also keeping your body healthy.   If you are trying to lose weight with exercise you should work out 4-5 times per week for 45 minutes per time.   Good luck with the fertility stuff.

## 2015-12-25 NOTE — Progress Notes (Signed)
Pre visit review using our clinic review tool, if applicable. No additional management support is needed unless otherwise documented below in the visit note. 

## 2015-12-25 NOTE — Progress Notes (Signed)
   Subjective:    Patient ID: Janet Bowen, female    DOB: November 30, 1980, 35 y.o.   MRN: [id number]  HPI The patient is a 35 YO female coming in for follow up on her blood pressure. We had changed medicine due to her wish for pregnancy. At home her blood pressure has been 120s/80. Just had sugars and complete metabolic which was normal. Has been exercising regularly the last couple of months. No new complaints or concerns. Still trying to get pregnant with fertility treatments.  Review of Systems  Constitutional: Negative for fever, activity change, appetite change and unexpected weight change.       Exercising  Respiratory: Negative for cough, chest tightness, shortness of breath and wheezing.   Cardiovascular: Negative for chest pain, palpitations and leg swelling.  Gastrointestinal: Negative for nausea, abdominal pain, diarrhea, constipation and abdominal distention.  Musculoskeletal: Negative.   Skin: Negative.   Neurological: Negative.   Psychiatric/Behavioral: Negative.       Objective:   Physical Exam  Constitutional: She is oriented to person, place, and time. She appears well-developed and well-nourished.  Overweight  HENT:  Head: Normocephalic and atraumatic.  Eyes: EOM are normal.  Neck: Normal range of motion.  Cardiovascular: Normal rate and regular rhythm.   Pulmonary/Chest: Effort normal and breath sounds normal. No respiratory distress. She has no wheezes. She has no rales.  Abdominal: Soft. Bowel sounds are normal. She exhibits no distension. There is no tenderness. There is no rebound.  Musculoskeletal: She exhibits no edema.  Neurological: She is alert and oriented to person, place, and time.  Skin: Skin is warm and dry.  Psychiatric: She has a normal mood and affect.   Filed Vitals:   12/25/15 0804  BP: 142/98  Pulse: 71  Temp: 98.3 F (36.8 C)  TempSrc: Oral  Resp: 14  Height: 5' 6.5" (1.689 m)  Weight: 222 lb (100.699 kg)  SpO2: 99%        Assessment & Plan:

## 2016-01-18 ENCOUNTER — Other Ambulatory Visit: Payer: Self-pay | Admitting: Internal Medicine

## 2016-02-14 ENCOUNTER — Other Ambulatory Visit: Payer: Self-pay | Admitting: Internal Medicine

## 2016-04-07 LAB — HM PAP SMEAR

## 2016-06-24 ENCOUNTER — Ambulatory Visit: Payer: 59 | Admitting: Internal Medicine

## 2016-07-01 ENCOUNTER — Encounter: Payer: Self-pay | Admitting: Internal Medicine

## 2016-07-01 ENCOUNTER — Other Ambulatory Visit (INDEPENDENT_AMBULATORY_CARE_PROVIDER_SITE_OTHER): Payer: 59

## 2016-07-01 ENCOUNTER — Ambulatory Visit (INDEPENDENT_AMBULATORY_CARE_PROVIDER_SITE_OTHER): Payer: 59 | Admitting: Internal Medicine

## 2016-07-01 VITALS — BP 120/88 | HR 80 | Wt 217.0 lb

## 2016-07-01 DIAGNOSIS — Z720 Tobacco use: Secondary | ICD-10-CM | POA: Diagnosis not present

## 2016-07-01 DIAGNOSIS — Z0000 Encounter for general adult medical examination without abnormal findings: Secondary | ICD-10-CM

## 2016-07-01 DIAGNOSIS — E669 Obesity, unspecified: Secondary | ICD-10-CM

## 2016-07-01 DIAGNOSIS — I10 Essential (primary) hypertension: Secondary | ICD-10-CM

## 2016-07-01 DIAGNOSIS — F17200 Nicotine dependence, unspecified, uncomplicated: Secondary | ICD-10-CM

## 2016-07-01 LAB — COMPREHENSIVE METABOLIC PANEL
ALK PHOS: 51 U/L (ref 39–117)
ALT: 13 U/L (ref 0–35)
AST: 15 U/L (ref 0–37)
Albumin: 4.1 g/dL (ref 3.5–5.2)
BUN: 9 mg/dL (ref 6–23)
CHLORIDE: 106 meq/L (ref 96–112)
CO2: 24 mEq/L (ref 19–32)
Calcium: 8.8 mg/dL (ref 8.4–10.5)
Creatinine, Ser: 0.83 mg/dL (ref 0.40–1.20)
GFR: 99.21 mL/min (ref >60.00)
GLUCOSE: 98 mg/dL (ref 70–99)
POTASSIUM: 3.9 meq/L (ref 3.5–5.1)
SODIUM: 138 meq/L (ref 135–145)
Total Bilirubin: 0.5 mg/dL (ref 0.2–1.2)
Total Protein: 7.3 g/dL (ref 6.0–8.3)

## 2016-07-01 LAB — HEMOGLOBIN A1C: HEMOGLOBIN A1C: 5.4 % (ref 4.6–6.5)

## 2016-07-01 LAB — LIPID PANEL
CHOL/HDL RATIO: 4
Cholesterol: 146 mg/dL (ref 0–200)
HDL: 38 mg/dL — AB (ref >39.00)
LDL Cholesterol: 94 mg/dL (ref 0–99)
NONHDL: 107.51
TRIGLYCERIDES: 68 mg/dL (ref 0.0–149.0)
VLDL: 13.6 mg/dL (ref 0.0–40.0)

## 2016-07-01 NOTE — Assessment & Plan Note (Signed)
Reminded her to work on quitting since smoking is a risk during pregnancy and she is working on it.

## 2016-07-01 NOTE — Progress Notes (Signed)
Pre visit review using our clinic review tool, if applicable. No additional management support is needed unless otherwise documented below in the visit note. 

## 2016-07-01 NOTE — Patient Instructions (Signed)
We are checking the labs today and will call you back with the results.    

## 2016-07-01 NOTE — Assessment & Plan Note (Signed)
Counseled about diet and exercise. 

## 2016-07-01 NOTE — Assessment & Plan Note (Signed)
Taking labetalol as she was thinking about pregnancy. She is currently taking a break from fertility. BP at goal today. Will need to watch closely if pregnant in the future. Checking CMP.

## 2016-07-01 NOTE — Progress Notes (Signed)
   Subjective:    Patient ID: Janet Bowen, female    DOB: 1981-02-27, 35 y.o.   MRN: [id number]  HPI The patient is a 35 YO female coming in for follow up of her blood pressure. She is taking the labetalol still. No side effects or problems. Denies headache or chest pains or SOB. She is not exercising due to the heat. She is still smoking a small amounts and is trying not to increase.   Review of Systems  Constitutional: Negative for activity change, appetite change, fever and unexpected weight change.  Respiratory: Negative for cough, chest tightness, shortness of breath and wheezing.   Cardiovascular: Negative for chest pain, palpitations and leg swelling.  Gastrointestinal: Negative for abdominal distention, abdominal pain, constipation, diarrhea and nausea.  Musculoskeletal: Negative.   Skin: Negative.   Neurological: Negative.   Psychiatric/Behavioral: Negative.       Objective:   Physical Exam  Constitutional: She is oriented to person, place, and time. She appears well-developed and well-nourished.  Overweight  HENT:  Head: Normocephalic and atraumatic.  Eyes: EOM are normal.  Neck: Normal range of motion.  Cardiovascular: Normal rate and regular rhythm.   Pulmonary/Chest: Effort normal and breath sounds normal. No respiratory distress. She has no wheezes. She has no rales.  Abdominal: Soft. She exhibits no distension. There is no tenderness. There is no rebound.  Neurological: She is alert and oriented to person, place, and time.  Skin: Skin is warm and dry.    Vitals:   07/01/16 0835  BP: 120/88  Pulse: 80  SpO2: 98%  Weight: 217 lb (98.4 kg)      Assessment & Plan:

## 2016-08-24 ENCOUNTER — Other Ambulatory Visit: Payer: Self-pay | Admitting: Internal Medicine

## 2016-11-21 ENCOUNTER — Other Ambulatory Visit: Payer: Self-pay | Admitting: Internal Medicine

## 2016-11-24 ENCOUNTER — Other Ambulatory Visit (HOSPITAL_COMMUNITY)
Admission: RE | Admit: 2016-11-24 | Discharge: 2016-11-24 | Disposition: A | Discharge status: Home / Self Care | Payer: 59 | Source: Ambulatory Visit | Attending: Obstetrics and Gynecology | Admitting: Obstetrics and Gynecology

## 2016-11-24 DIAGNOSIS — N979 Female infertility, unspecified: Secondary | ICD-10-CM | POA: Insufficient documentation

## 2016-11-25 LAB — PROGESTERONE: PROGESTERONE: 1.7 ng/mL

## 2016-11-30 LAB — PROLACTIN: Prolactin: 0.6 ng/mL — ABNORMAL LOW (ref 4.8–23.3)

## 2016-12-06 LAB — PROLACTIN: Prolactin: 0.6 ng/mL — ABNORMAL LOW (ref 4.8–23.3)

## 2017-01-22 ENCOUNTER — Other Ambulatory Visit (HOSPITAL_COMMUNITY)
Admission: AD | Admit: 2017-01-22 | Discharge: 2017-01-22 | Disposition: A | Discharge status: Home / Self Care | Payer: 59 | Source: Ambulatory Visit | Attending: Obstetrics and Gynecology | Admitting: Obstetrics and Gynecology

## 2017-01-22 DIAGNOSIS — N979 Female infertility, unspecified: Secondary | ICD-10-CM | POA: Diagnosis present

## 2017-01-23 LAB — PROGESTERONE: PROGESTERONE: 11.2 ng/mL

## 2017-01-31 ENCOUNTER — Other Ambulatory Visit (HOSPITAL_COMMUNITY): Payer: Self-pay | Admitting: Obstetrics and Gynecology

## 2017-01-31 DIAGNOSIS — N979 Female infertility, unspecified: Secondary | ICD-10-CM

## 2017-03-06 ENCOUNTER — Ambulatory Visit (HOSPITAL_COMMUNITY)
Admission: RE | Admit: 2017-03-06 | Discharge: 2017-03-06 | Disposition: A | Discharge status: Home / Self Care | Payer: 59 | Source: Ambulatory Visit | Attending: Obstetrics and Gynecology | Admitting: Obstetrics and Gynecology

## 2017-03-06 DIAGNOSIS — N979 Female infertility, unspecified: Secondary | ICD-10-CM | POA: Diagnosis present

## 2017-03-06 MED ORDER — IOPAMIDOL (ISOVUE-300) INJECTION 61%
25.0000 mL | Freq: Once | INTRAVENOUS | Status: AC | PRN
  Administered 2017-03-06: 5 mL

## 2017-03-07 ENCOUNTER — Ambulatory Visit (HOSPITAL_COMMUNITY): Payer: 59

## 2017-12-21 ENCOUNTER — Other Ambulatory Visit: Payer: Self-pay | Admitting: Internal Medicine

## 2018-01-16 ENCOUNTER — Ambulatory Visit (INDEPENDENT_AMBULATORY_CARE_PROVIDER_SITE_OTHER): Payer: 59 | Admitting: Internal Medicine

## 2018-01-16 ENCOUNTER — Encounter: Payer: Self-pay | Admitting: Internal Medicine

## 2018-01-16 ENCOUNTER — Other Ambulatory Visit (INDEPENDENT_AMBULATORY_CARE_PROVIDER_SITE_OTHER): Payer: 59

## 2018-01-16 VITALS — BP 130/90 | HR 79 | Temp 98.1°F | Ht 66.5 in | Wt 235.0 lb

## 2018-01-16 DIAGNOSIS — Z0000 Encounter for general adult medical examination without abnormal findings: Secondary | ICD-10-CM

## 2018-01-16 DIAGNOSIS — R739 Hyperglycemia, unspecified: Secondary | ICD-10-CM

## 2018-01-16 DIAGNOSIS — F17200 Nicotine dependence, unspecified, uncomplicated: Secondary | ICD-10-CM

## 2018-01-16 DIAGNOSIS — I10 Essential (primary) hypertension: Secondary | ICD-10-CM

## 2018-01-16 LAB — HEMOGLOBIN A1C: HEMOGLOBIN A1C: 5.7 % (ref 4.6–6.5)

## 2018-01-16 LAB — COMPREHENSIVE METABOLIC PANEL
ALT: 15 U/L (ref 0–35)
AST: 14 U/L (ref 0–37)
Albumin: 3.8 g/dL (ref 3.5–5.2)
Alkaline Phosphatase: 57 U/L (ref 39–117)
BILIRUBIN TOTAL: 0.2 mg/dL (ref 0.2–1.2)
BUN: 11 mg/dL (ref 6–23)
CO2: 24 meq/L (ref 19–32)
Calcium: 8.8 mg/dL (ref 8.4–10.5)
Chloride: 108 mEq/L (ref 96–112)
Creatinine, Ser: 0.83 mg/dL (ref 0.40–1.20)
GFR: 98.4 mL/min (ref >60.00)
GLUCOSE: 99 mg/dL (ref 70–99)
POTASSIUM: 3.9 meq/L (ref 3.5–5.1)
SODIUM: 139 meq/L (ref 135–145)
TOTAL PROTEIN: 6.8 g/dL (ref 6.0–8.3)

## 2018-01-16 LAB — LIPID PANEL
CHOL/HDL RATIO: 4
Cholesterol: 138 mg/dL (ref 0–200)
HDL: 37 mg/dL — ABNORMAL LOW (ref >39.00)
LDL Cholesterol: 87 mg/dL (ref 0–99)
NONHDL: 101.28
Triglycerides: 71 mg/dL (ref 0.0–149.0)
VLDL: 14.2 mg/dL (ref 0.0–40.0)

## 2018-01-16 LAB — CBC
HCT: 40.4 % (ref 36.0–46.0)
HEMOGLOBIN: 13.7 g/dL (ref 12.0–15.0)
MCHC: 33.9 g/dL (ref 30.0–36.0)
MCV: 87.9 fl (ref 78.0–100.0)
Platelets: 246 10*3/uL (ref 150.0–400.0)
RBC: 4.6 Mil/uL (ref 3.87–5.11)
RDW: 13.2 % (ref 11.5–15.5)
WBC: 5.8 10*3/uL (ref 4.0–10.5)

## 2018-01-16 LAB — TSH: TSH: 1.46 u[IU]/mL (ref 0.35–4.50)

## 2018-01-16 MED ORDER — LABETALOL HCL 100 MG PO TABS: ORAL_TABLET | ORAL | 3 refills | Status: DC

## 2018-01-16 NOTE — Assessment & Plan Note (Signed)
Checking HgA1c today.  

## 2018-01-16 NOTE — Progress Notes (Signed)
   Subjective:    Patient ID: Janet Bowen, female    DOB: 1981/01/23, 37 y.o.   MRN: [id number]  HPI The patient is a 37 YO female coming in for physical.   PMH, [place], social history reviewed and updated  Review of Systems  Constitutional: Negative.   HENT: Negative.   Eyes: Negative.   Respiratory: Negative for cough, chest tightness and shortness of breath.   Cardiovascular: Negative for chest pain, palpitations and leg swelling.  Gastrointestinal: Negative for abdominal distention, abdominal pain, constipation, diarrhea, nausea and vomiting.  Musculoskeletal: Negative.   Skin: Negative.   Neurological: Negative.   Psychiatric/Behavioral: Negative.       Objective:   Physical Exam  Constitutional: She is oriented to person, place, and time. She appears well-developed and well-nourished.  HENT:  Head: Normocephalic and atraumatic.  Eyes: EOM are normal.  Neck: Normal range of motion.  Cardiovascular: Normal rate and regular rhythm.  Pulmonary/Chest: Effort normal and breath sounds normal. No respiratory distress. She has no wheezes. She has no rales.  Abdominal: Soft. Bowel sounds are normal. She exhibits no distension. There is no tenderness. There is no rebound.  Musculoskeletal: She exhibits no edema.  Neurological: She is alert and oriented to person, place, and time. Coordination normal.  Skin: Skin is warm and dry.  Psychiatric: She has a normal mood and affect.   Vitals:   01/16/18 0817  BP: 130/90  Pulse: 79  Temp: 98.1 F (36.7 C)  TempSrc: Oral  SpO2: 98%  Weight: 235 lb (106.6 kg)  Height: 5' 6.5" (1.689 m)      Assessment & Plan:

## 2018-01-16 NOTE — Assessment & Plan Note (Signed)
Time spent counseling about tobacco usage: 3 minutes. I have asked about smoking and is smoking same as usual. The patient is advised to quit. The patient is not willing to quit. They would like to try to quit in the next 12 months. We will follow up with them in 12 months.  

## 2018-01-16 NOTE — Assessment & Plan Note (Signed)
Refill labetalol. Out for several days and BP borderline. Given that she is still trying to get pregnant will not change BP med. Checking CMP and adjust as needed.

## 2018-01-16 NOTE — Patient Instructions (Signed)

## 2018-01-16 NOTE — Assessment & Plan Note (Signed)
Flu shot declined. HIV screening with gyn. Pap smear up to date with gyn. Counseled about smoking cessation and dangers of distracted driving. Given screening recommendations.

## 2018-01-20 ENCOUNTER — Telehealth: Payer: Self-pay | Admitting: Internal Medicine

## 2018-01-20 NOTE — Telephone Encounter (Signed)
Copied from [place] [id number]. Topic: Quick Communication - Lab Results >> Jan 19, 2018 10:09 AM Blondell Reveal, CMA wrote: Called patient to inform them of 01/16/2018 lab results. When patient returns call, triage nurse may disclose results.   Pt called back - said to please leave message if no answer

## 2018-01-20 NOTE — Telephone Encounter (Signed)
Left message for pt to return call to the office so that lab results could be disclosed. Attempt documented in result note.

## 2018-01-26 ENCOUNTER — Ambulatory Visit: Payer: Self-pay

## 2018-01-26 NOTE — Telephone Encounter (Signed)
Pt. Called and given lab results. Verbalizes understanding.

## 2018-04-22 ENCOUNTER — Encounter: Payer: Self-pay | Admitting: Internal Medicine

## 2018-04-22 NOTE — Progress Notes (Signed)
Abstracted and sent to scan  

## 2019-02-11 ENCOUNTER — Other Ambulatory Visit: Payer: Self-pay | Admitting: Internal Medicine

## 2019-04-22 ENCOUNTER — Other Ambulatory Visit: Payer: Self-pay

## 2019-04-22 ENCOUNTER — Encounter (HOSPITAL_COMMUNITY): Payer: Self-pay | Admitting: Emergency Medicine

## 2019-04-22 ENCOUNTER — Emergency Department (HOSPITAL_COMMUNITY)
Admission: EM | Admit: 2019-04-22 | Discharge: 2019-04-23 | Disposition: A | Discharge status: Home / Self Care | Payer: 59 | Attending: Emergency Medicine | Admitting: Emergency Medicine

## 2019-04-22 DIAGNOSIS — G510 Bell's palsy: Secondary | ICD-10-CM

## 2019-04-22 DIAGNOSIS — Z79899 Other long term (current) drug therapy: Secondary | ICD-10-CM | POA: Insufficient documentation

## 2019-04-22 DIAGNOSIS — Z3A16 16 weeks gestation of pregnancy: Secondary | ICD-10-CM | POA: Diagnosis not present

## 2019-04-22 DIAGNOSIS — O10012 Pre-existing essential hypertension complicating pregnancy, second trimester: Secondary | ICD-10-CM | POA: Insufficient documentation

## 2019-04-22 DIAGNOSIS — Z03818 Encounter for observation for suspected exposure to other biological agents ruled out: Secondary | ICD-10-CM | POA: Insufficient documentation

## 2019-04-22 DIAGNOSIS — O99352 Diseases of the nervous system complicating pregnancy, second trimester: Secondary | ICD-10-CM | POA: Insufficient documentation

## 2019-04-22 DIAGNOSIS — F17210 Nicotine dependence, cigarettes, uncomplicated: Secondary | ICD-10-CM | POA: Diagnosis not present

## 2019-04-22 DIAGNOSIS — R29810 Facial weakness: Secondary | ICD-10-CM

## 2019-04-22 DIAGNOSIS — O99332 Smoking (tobacco) complicating pregnancy, second trimester: Secondary | ICD-10-CM | POA: Diagnosis not present

## 2019-04-22 LAB — URINALYSIS, ROUTINE W REFLEX MICROSCOPIC
Bilirubin Urine: NEGATIVE
Glucose, UA: NEGATIVE mg/dL
Hgb urine dipstick: NEGATIVE
Ketones, ur: NEGATIVE mg/dL
Leukocytes,Ua: NEGATIVE
Nitrite: NEGATIVE
Protein, ur: NEGATIVE mg/dL
Specific Gravity, Urine: 1.024 (ref 1.005–1.030)
pH: 6 (ref 5.0–8.0)

## 2019-04-22 LAB — COMPREHENSIVE METABOLIC PANEL
ALT: 19 U/L (ref 0–44)
AST: 23 U/L (ref 15–41)
Albumin: 3.5 g/dL (ref 3.5–5.0)
Alkaline Phosphatase: 51 U/L (ref 38–126)
Anion gap: 11 (ref 5–15)
BUN: 11 mg/dL (ref 6–20)
CO2: 20 mmol/L — ABNORMAL LOW (ref 22–32)
Calcium: 9 mg/dL (ref 8.9–10.3)
Chloride: 107 mmol/L (ref 98–111)
Creatinine, Ser: 0.69 mg/dL (ref 0.44–1.00)
GFR calc Af Amer: >60 mL/min (ref >60)
GFR calc non Af Amer: >60 mL/min (ref >60)
Glucose, Bld: 102 mg/dL — ABNORMAL HIGH (ref 70–99)
Potassium: 3.7 mmol/L (ref 3.5–5.1)
Sodium: 138 mmol/L (ref 135–145)
Total Bilirubin: <0.1 mg/dL — ABNORMAL LOW (ref 0.3–1.2)
Total Protein: 6.9 g/dL (ref 6.5–8.1)

## 2019-04-22 LAB — CBC WITH DIFFERENTIAL/PLATELET
Abs Immature Granulocytes: 0.02 10*3/uL (ref 0.00–0.07)
Basophils Absolute: 0 10*3/uL (ref 0.0–0.1)
Basophils Relative: 0 %
Eosinophils Absolute: 0.1 10*3/uL (ref 0.0–0.5)
Eosinophils Relative: 1 %
HCT: 35.4 % — ABNORMAL LOW (ref 36.0–46.0)
Hemoglobin: 11.8 g/dL — ABNORMAL LOW (ref 12.0–15.0)
Immature Granulocytes: 0 %
Lymphocytes Relative: 27 %
Lymphs Abs: 2 10*3/uL (ref 0.7–4.0)
MCH: 29.5 pg (ref 26.0–34.0)
MCHC: 33.3 g/dL (ref 30.0–36.0)
MCV: 88.5 fL (ref 80.0–100.0)
Monocytes Absolute: 0.9 10*3/uL (ref 0.1–1.0)
Monocytes Relative: 12 %
Neutro Abs: 4.2 10*3/uL (ref 1.7–7.7)
Neutrophils Relative %: 60 %
Platelets: 235 10*3/uL (ref 150–400)
RBC: 4 MIL/uL (ref 3.87–5.11)
RDW: 13.1 % (ref 11.5–15.5)
WBC: 7.2 10*3/uL (ref 4.0–10.5)
nRBC: 0 % (ref 0.0–0.2)

## 2019-04-22 MED ORDER — SODIUM CHLORIDE 0.9% FLUSH: 3.0000 mL | Freq: Once | INTRAVENOUS | Status: DC

## 2019-04-22 NOTE — ED Provider Notes (Signed)
[facility] DEPT Provider Note   CSN: [id number] Arrival date & time: 04/22/19  2004    History   Chief Complaint Chief Complaint  Patient presents with  . Numbness    HPI Janet Bowen is a 38 y.o. female [redacted] weeks pregnant with history of hyperprolactinemia, hypertension, obesity, PCOS presenting today for her right-sided facial droop x4 days as well as right facial pain and paresthesias that began this morning.  Patient reports that on Monday, 04/19/2019 she noted a numbing-like sensation and weakness of the right side of her face that has been constant since onset, patient reports that she notices when she smiles that the right side of her face appears to droop and when she puckers her lips her lips push towards the left side.  Patient reports that this morning 04/22/2019 at 7:30 AM she began to have a dull throbbing sensation to her right cheekbone that is only present with palpation and she reports some paresthesias as well.  Patient reports that she has never had this problem before.  She reports that she is [redacted] weeks pregnant and has good follow-up with OB/GYN and an appointment tomorrow morning.  Additionally patient endorses some taste discrepancies on the right side of her tongue.     HPI  Past Medical History:  Diagnosis Date  . Hirsutism   . Hyperprolactinemia ([place])   . Hypertension   . Obesity   . PCOS (polycystic ovarian syndrome)    Dr. Simona Huh (GYN)    Patient Active Problem List   Diagnosis Date Noted  . Routine general medical examination at a health care facility 01/16/2018  . Smoker 10/13/2015  . Hyperglycemia 10/13/2015  . Obesity 06/22/2015  . Essential hypertension, benign 01/27/2013  . PCOS (polycystic ovarian syndrome) 01/27/2013    Past Surgical History:  Procedure Laterality Date  . CERVICAL CONE BIOPSY  04-08-2008  . COLPOSCOPY  03-15-2008     OB History    Gravida  1   Para  0   Term  0   Preterm  0   AB  0   Living  0     SAB  0   TAB  0   Ectopic  0   Multiple  0   Live Births               Home Medications    Prior to Admission medications   Medication Sig Start Date End Date Taking? Authorizing Provider  cabergoline (DOSTINEX) 0.5 MG tablet TK 1 T PO 2 TIMES A WK 12/21/17   [provider]  labetalol (NORMODYNE) 100 MG tablet Take 1 tablet (100 mg total) by mouth 2 (two) times daily. Need annual visit for further refills 02/11/19   Hoyt Koch, MD  Prenatal Vit-Fe Fumarate-FA (PRENATABS FA) TABS TK 1 T PO QD 09/19/15   [provider]    Family History Family History  Problem Relation Age of Onset  . Hypertension Father   . Hyperlipidemia Father   . Cerebral aneurysm Sister   . Diabetes Maternal Aunt   . Multiple myeloma Maternal Grandmother   . Stroke Neg Hx     Social History Social History   Tobacco Use  . Smoking status: Current Some Day Smoker    Packs/day: 0.50    Years: 15.00    Pack years: 7.50    Types: Cigarettes    Last attempt to quit: 11/05/2011    Years since quitting: 7.4  . Smokeless  tobacco: Never Used  Substance Use Topics  . Alcohol use: No    Alcohol/week: 0.0 standard drinks  . Drug use: Yes    Types: Marijuana    Comment: marijuana, 2 x per week.     Allergies   Patient has no known allergies.   Review of Systems Review of Systems  Constitutional: Negative.  Negative for chills and fever.  Eyes: Negative.  Negative for pain and visual disturbance.  Musculoskeletal: Negative.  Negative for arthralgias and myalgias.  Neurological: Positive for facial asymmetry, weakness and numbness. Negative for dizziness, syncope, speech difficulty and headaches.  All other systems reviewed and are negative.  Physical Exam Updated Vital Signs BP (!) 142/89   Pulse 88   Temp 98.3 F (36.8 C) (Oral)   Resp 20   Ht '5\' 6"'$  (1.676 m)   Wt 106.6 kg   SpO2 98%   BMI 37.93 kg/m   Physical Exam  Constitutional:      General: She is not in acute distress.    Appearance: Normal appearance. She is well-developed. She is not ill-appearing or diaphoretic.  HENT:     Head: Normocephalic and atraumatic.     Jaw: There is normal jaw occlusion. No trismus.     Right Ear: Tympanic membrane, ear canal and external ear normal.     Left Ear: Tympanic membrane, ear canal and external ear normal.     Nose: Nose normal.     Mouth/Throat:     Mouth: Mucous membranes are moist.     Dentition: No gingival swelling.     Pharynx: Oropharynx is clear. Uvula midline. No pharyngeal swelling.  Eyes:     General: Vision grossly intact. Gaze aligned appropriately.     Pupils: Pupils are equal, round, and reactive to light.  Neck:     Musculoskeletal: Normal range of motion.     Trachea: Trachea and phonation normal. No tracheal deviation.  Pulmonary:     Effort: Pulmonary effort is normal. No respiratory distress.  Abdominal:     General: There is no distension.     Palpations: Abdomen is soft.     Tenderness: There is no abdominal tenderness. There is no guarding or rebound.  Musculoskeletal: Normal range of motion.  Skin:    General: Skin is warm and dry.  Neurological:     Mental Status: She is alert.     GCS: GCS eye subscore is 4. GCS verbal subscore is 5. GCS motor subscore is 6.     Comments: Mental Status: Alert, oriented, thought content appropriate, able to give a coherent history. Speech fluent without evidence of aphasia. Able to follow 2 step commands without difficulty. Cranial Nerves: II: Peripheral visual fields grossly normal, pupils equal, round, reactive to light III,IV, VI: ptosis not present, extra-ocular motions intact bilaterally  V,VII: smile asymmetric with right sided droop, leftward lip pucker, unable to contain air with cheek expansion, eyebrows raise symmetric, facial light touch sensation equal  VIII: hearing grossly normal to voice X: uvula elevates  symmetrically XI: bilateral shoulder shrug symmetric and strong XII: midline tongue extension without fassiculations Motor: Normal tone. 5/5 strength in upper and lower extremities bilaterally including strong and equal grip strength and dorsiflexion/plantar flexion Sensory: Sensation intact to light touch in all extremities.Negative Romberg.  Deep Tendon Reflexes: 2+ and symmetric patella Cerebellar: normal finger-to-nose with bilateral upper extremities. Normal heel-to -shin balance bilaterally of the lower extremity. No pronator drift.  Gait: normal gait and balance CV: distal pulses  palpable throughout  Psychiatric:        Behavior: Behavior normal.    ED Treatments / Results  Labs (all labs ordered are listed, but only abnormal results are displayed) Labs Reviewed  URINALYSIS, ROUTINE W REFLEX MICROSCOPIC - Abnormal; Notable for the following components:      Result Value   APPearance HAZY (*)    All other components within normal limits  COMPREHENSIVE METABOLIC PANEL - Abnormal; Notable for the following components:   CO2 20 (*)    Glucose, Bld 102 (*)    Total Bilirubin <0.1 (*)    All other components within normal limits  CBC WITH DIFFERENTIAL/PLATELET - Abnormal; Notable for the following components:   Hemoglobin 11.8 (*)    HCT 35.4 (*)    All other components within normal limits  SARS CORONAVIRUS 2 (HOSPITAL ORDER, [facility])    EKG EKG Interpretation  Date/Time:  Thursday April 22 2019 22:47:33 EDT Ventricular Rate:  86 PR Interval:    QRS Duration: 84 QT Interval:  365 QTC Calculation: 437 R Axis:   49 Text Interpretation:  Sinus rhythm Low voltage, precordial leads Baseline wander in lead(s) V1 No old tracing to compare Confirmed by Lacretia Leigh ([id number]) on 04/22/2019 10:51:08 PM   Radiology No results found.  Procedures Procedures (including critical care time)  Medications Ordered in ED Medications - No data to  display   Initial Impression / Assessment and Plan / ED Course  I have reviewed the triage vital signs and the nursing notes.  Pertinent labs & imaging results that were available during my care of the patient were reviewed by me and considered in my medical decision making (see chart for details).     38 year old female, [redacted] weeks pregnant presents today for right-sided facial droop x3 days.  Obvious right lower facial droop on examination, mild right lid lag, taste discrepancies of the right side tongue per patient.  Possible Bell's palsy however there is no forehead involvement, consult was called to neurology.  CBC nonacute  Discussed case with Dr. Cheral Marker from neurology who is asked that we transfer patient to [facility] for MRI brain and for him to evaluate the patient. - Patient seen and evaluated with Dr. Zenia Resides who also discussed case with Dr. Cheral Marker.  Discussed plan of care for transfer to [facility] via [facility] with the patient and she is agreeable.  Call to [facility] for ER, accepting physician Dr. Maryan Rued.  [facility] to be contacted by [facility]. - CMP nonacute Urinalysis nonacute COVID-19 negative - 12:50 AM: Patient reevaluated resting comfortably and in no acute distress.  No change to presentation.  Very pleasant patient states understanding and wait for transfer, no new complaints.  [facility] has been contacted for transfer.  Vital signs have remained stable. - Patient reevaluated no change, vital signs stable. - Patient has been transferred to [facility] for neurology evaluation and MRI.  Note: Portions of this report may have been transcribed using voice recognition software. Every effort was made to ensure accuracy; however, inadvertent computerized transcription errors may still be present. Final Clinical Impressions(s) / ED Diagnoses   Final diagnoses:  Weakness on right side of face    ED Discharge Orders    None       Gari Crown  04/23/19 0318    Lacretia Leigh, MD 04/26/19 1027

## 2019-04-22 NOTE — ED Triage Notes (Signed)
Patient here from home with complaints of right side facial pain, numbness, and tingling that started on Monday. Reports that she is [redacted] weeks pregnant. No facial droop noted. AAO x4.

## 2019-04-22 NOTE — ED Notes (Signed)
ED TO INPATIENT HANDOFF REPORT  ED Nurse Name and Phone #: [place] [facility], [phone]  S Name/Age/Gender Janet Bowen 38 y.o. female Room/Bed: WA20/WA20  Code Status   Code Status: Not on file  Home/SNF/Other Home Patient oriented to: self, place, time and situation Is this baseline? Yes   Triage Complete: Triage complete  Chief Complaint Right side facial numbness, (4 months pregnant)  Triage Note Patient here from home with complaints of right side facial pain, numbness, and tingling that started on Monday. Reports that she is [redacted] weeks pregnant. No facial droop noted. AAO x4.    Allergies No Known Allergies  Level of Care/Admitting Diagnosis ED Disposition    ED Disposition Condition Comment   Transfer to Another Facility  The patient appears reasonably stabilized for transfer considering the current resources, flow, and capabilities available in the ED at this time, and I doubt any other [facility] requiring further screening and/or treatment in the ED prior to transfer is p resent.       B Medical/Surgery History Past Medical History:  Diagnosis Date  . Hirsutism   . Hyperprolactinemia ([place])   . Hypertension   . Obesity   . PCOS (polycystic ovarian syndrome)    Dr. Simona Huh (GYN)   Past Surgical History:  Procedure Laterality Date  . CERVICAL CONE BIOPSY  04-08-2008  . COLPOSCOPY  03-15-2008     A IV Location/Drains/Wounds Patient Lines/Drains/Airways Status   Active Line/Drains/Airways    Name:   Placement date:   Placement time:   Site:   Days:   Peripheral IV 04/22/19 Left Hand   04/22/19    2120    Hand   less than 1          Intake/Output Last 24 hours No intake or output data in the 24 hours ending 04/22/19 2307  Labs/Imaging Results for orders placed or performed during the hospital encounter of 04/22/19 (from the past 48 hour(s))  Comprehensive metabolic panel     Status: Abnormal   Collection Time: 04/22/19  9:15 PM  Result Value Ref Range   Sodium 138 135 - 145 mmol/L   Potassium 3.7 3.5 - 5.1 mmol/L   Chloride 107 98 - 111 mmol/L   CO2 20 (L) 22 - 32 mmol/L   Glucose, Bld 102 (H) 70 - 99 mg/dL   BUN 11 6 - 20 mg/dL   Creatinine, Ser 0.69 0.44 - 1.00 mg/dL   Calcium 9.0 8.9 - 10.3 mg/dL   Total Protein 6.9 6.5 - 8.1 g/dL   Albumin 3.5 3.5 - 5.0 g/dL   AST 23 15 - 41 U/L   ALT 19 0 - 44 U/L   Alkaline Phosphatase 51 38 - 126 U/L   Total Bilirubin <0.1 (L) 0.3 - 1.2 mg/dL   GFR calc non Af Amer >60 >60 mL/min   GFR calc Af Amer >60 >60 mL/min   Anion gap 11 5 - 15    Comment: Performed at [facility], [place] [address], [place], [place] [address]  CBC WITH DIFFERENTIAL     Status: Abnormal   Collection Time: 04/22/19  9:15 PM  Result Value Ref Range   WBC 7.2 4.0 - 10.5 K/uL   RBC 4.00 3.87 - 5.11 MIL/uL   Hemoglobin 11.8 (L) 12.0 - 15.0 g/dL   HCT 35.4 (L) 36.0 - 46.0 %   MCV 88.5 80.0 - 100.0 fL   MCH 29.5 26.0 - 34.0 pg   MCHC 33.3 30.0 -  36.0 g/dL   RDW 13.1 11.5 - 15.5 %   Platelets 235 150 - 400 K/uL   nRBC 0.0 0.0 - 0.2 %   Neutrophils Relative % 60 %   Neutro Abs 4.2 1.7 - 7.7 K/uL   Lymphocytes Relative 27 %   Lymphs Abs 2.0 0.7 - 4.0 K/uL   Monocytes Relative 12 %   Monocytes Absolute 0.9 0.1 - 1.0 K/uL   Eosinophils Relative 1 %   Eosinophils Absolute 0.1 0.0 - 0.5 K/uL   Basophils Relative 0 %   Basophils Absolute 0.0 0.0 - 0.1 K/uL   Immature Granulocytes 0 %   Abs Immature Granulocytes 0.02 0.00 - 0.07 K/uL    Comment: Performed at [facility], [place] [address], [place], [place] [address]   No results found.  Pending Labs Unresulted Labs (From admission, onward)    Start     Ordered   04/22/19 2209  Urinalysis, Routine w reflex microscopic  ONCE - STAT,   STAT     04/22/19 2209          Vitals/Pain Today's Vitals   04/22/19 2017 04/22/19 2119 04/22/19 2130 04/22/19 2200  BP:   126/81 128/80  Pulse:   90 85  Resp:   16 15  Temp:      TempSrc:       SpO2:   98% 97%  Weight:      Height:      PainSc: 4  0-No pain      Isolation Precautions No active isolations  Medications Medications - No data to display  Mobility walks Low fall risk

## 2019-04-22 NOTE — ED Notes (Signed)
Patient states she has been having jaw/ right sided mouth pain and swelling since Monday, noticed some numbness/ lip deviation when she presses her lips together today.

## 2019-04-22 NOTE — ED Notes (Addendum)
Dr. Theora Gianotti is the accepting ED physician at [facility], Dr. Cheral Marker is neuro DR. [facility] charge RN called and notified, [facility] called for transport.

## 2019-04-22 NOTE — ED Notes (Signed)
Patient ambulated to RR to provide urine sample, no assistance needed, no alterations in gait, steady ambulation.

## 2019-04-23 ENCOUNTER — Encounter (HOSPITAL_COMMUNITY): Payer: Self-pay

## 2019-04-23 ENCOUNTER — Other Ambulatory Visit: Payer: Self-pay

## 2019-04-23 DIAGNOSIS — G510 Bell's palsy: Secondary | ICD-10-CM

## 2019-04-23 LAB — SARS CORONAVIRUS 2 BY RT PCR (HOSPITAL ORDER, PERFORMED IN ~~LOC~~ HOSPITAL LAB): SARS Coronavirus 2: NEGATIVE

## 2019-04-23 MED ORDER — VALACYCLOVIR HCL 500 MG PO TABS
1000.0000 mg | ORAL_TABLET | Freq: Once | ORAL | Status: AC
  Administered 2019-04-23: 1000 mg via ORAL
  Filled 2019-04-23: qty 2

## 2019-04-23 MED ORDER — ARTIFICIAL TEARS OPHTHALMIC OINT
1.0000 "application " | TOPICAL_OINTMENT | OPHTHALMIC | Status: DC | PRN
  Administered 2019-04-23: 1 via OPHTHALMIC
  Filled 2019-04-23: qty 3.5

## 2019-04-23 MED ORDER — VALACYCLOVIR HCL 1 G PO TABS: 1000.0000 mg | ORAL_TABLET | Freq: Three times a day (TID) | ORAL | 0 refills | Status: AC

## 2019-04-23 MED ORDER — PREDNISONE 20 MG PO TABS
60.0000 mg | ORAL_TABLET | Freq: Once | ORAL | Status: AC
  Administered 2019-04-23: 60 mg via ORAL
  Filled 2019-04-23: qty 3

## 2019-04-23 MED ORDER — PREDNISONE 50 MG PO TABS: ORAL_TABLET | ORAL | 0 refills | Status: DC

## 2019-04-23 NOTE — ED Provider Notes (Signed)
Patient transferred from [facility]  [place] [facility] for evaluation for possible stroke Patient is currently [redacted] weeks pregnant-she denies any complications, denies abdominal pain, denies any bleeding  She reports over the past 2 days she has had right-sided facial weakness and numbness.  She reports taste disturbance.  No visual changes.  No hearing changes.  She was sent for further evaluation.  There was concern for stroke  CONSTITUTIONAL: Well developed/well nourished HEAD: Normocephalic/atraumatic EYES: EOMI/PERRL, difficulty closing OD ENMT: Mucous membranes moist, no discharge noted from right ear, no facial rash NECK: supple no meningeal signs, no bruits CV: S1/S2 noted, no murmurs/rubs/gallops noted LUNGS: Lungs are clear to auscultation bilaterally, no apparent distress ABDOMEN: soft, nontender, no rebound or guarding NEURO:Awake/alert, right facial droop noted no arm or leg drift is noted Equal 5/5 strength with shoulder abduction, elbow flex/extension, wrist flex/extension in upper extremities and equal hand grips bilaterally Equal 5/5 strength with hip flexion,knee flex/extension, foot dorsi/plantar flexion Cranial nerves 3/4/5/04/12/09/11/12 tested and intact Facial nerve palsy noted No past pointing Sensation to light touch intact in all extremities EXTREMITIES: pulses normal, full ROM SKIN: warm, color normal PSYCH: no abnormalities of mood noted   On my exam, patient appears to have Bell's palsy. Patient would like to be discharged and would like to avoid MRI.  She has been seen by Dr. Cheral Marker who also agrees that this is likely Bell's palsy.  I spoken to Dr. Valentino Saxon with OB/GYN.  Since she is at 16 weeks, it is safe to start prednisone and  also recommend valacyclovir due to the severity of Bell's palsy in pregnancy.  Patient will also be given eye ointment as well  Patient feels comfortable with this plan, she has follow with OB/GYN later today.   Ripley Fraise, MD 04/23/19 [phone]

## 2019-04-23 NOTE — ED Triage Notes (Signed)
Pt arrives as transfer from [facility] ED. Pt presented to prior facility with complaint of right sided facial numbness for the past four days. Pt is currently [redacted] weeks pregnant. Pt transferred here for MRI.

## 2019-04-23 NOTE — ED Provider Notes (Signed)
On further discussion with Dr. Cheral Marker, he now recommends MRI brain due to paresthesias and facial pain. Patient denies any facial pain at this time, mostly just feels weak in her face. She declines MRI at this time, and I did stress the fact that there could be a lesion in her brain, but she would rather have this done as an outpatient.  Patient will discuss with her OB/GYN later today about having MRI In 5-7 days   Ripley Fraise, MD 04/23/19 [phone]

## 2019-04-23 NOTE — ED Notes (Signed)
Patient verbalized understanding of dc instructions, vss, ambulatory with nad.   

## 2019-04-23 NOTE — ED Notes (Signed)
ED Provider at bedside. 

## 2019-04-23 NOTE — Consult Note (Signed)
NEURO HOSPITALIST CONSULT NOTE   Requestig physician: Dr. Christy Gentles  Reason for Consult: Bell's palsy  History obtained from:    Patient and Chart     HPI:                                                                                                                                          Janet Bowen is a 38 y.o. female who is [redacted] weeks pregnant, presenting with a 4 day history of right facial droop and right facial pain with paresthesias. She also endorses some loss of taste involving the right side of her tongue. Denies hyperacusis. No limb weakness or numbness. Does not endorse limb ataxia or difficulty walking. She has a history of PCOS, morbid obesity, HTN, hyperprolactinemia and hirsutism.   Chart reviewed, including the history obtained by the ED provider at [facility], which is consistent with the history provided by the patient her at the [facility] ED.   Past Medical History:  Diagnosis Date  . Hirsutism   . Hyperprolactinemia ([place])   . Hypertension   . Obesity   . PCOS (polycystic ovarian syndrome)    Dr. Simona Huh (GYN)    Past Surgical History:  Procedure Laterality Date  . CERVICAL CONE BIOPSY  04-08-2008  . COLPOSCOPY  03-15-2008    Family History  Problem Relation Age of Onset  . Hypertension Father   . Hyperlipidemia Father   . Cerebral aneurysm Sister   . Diabetes Maternal Aunt   . Multiple myeloma Maternal Grandmother   . Stroke Neg Hx               Social History:  reports that she has been smoking cigarettes. She has a 7.50 pack-year smoking history. She has never used smokeless tobacco. She reports current drug use. Drug: Marijuana. She reports that she does not drink alcohol.  No Known Allergies  HOME MEDICATIONS:                                                                                                                     ASA Labetalol Prenatal vitamins   ROS:  As per HPI with all other systems negative.    Blood pressure (!) 151/90, pulse 82, temperature 98 F (36.7 C), resp. rate 16, height '5\' 6"'$  (1.676 m), weight 106 kg, SpO2 99 %.   General Examination:                                                                                                       Physical Exam  HEENT-  [place]/AT    Lungs- Respirations unlabored   Extremities- No edema  Neurological Examination Mental Status: Alert, oriented, thought content appropriate.  Speech fluent without evidence of aphasia.  Able to follow all commands without difficulty. Cranial Nerves:  II: Visual fields intact with no extinction to DSS, PERRL III,IV, VI: ptosis not present, EOMI V,VII: Mild facial droop right lower quadrant of face. Right lid lags left when blinking. Right lid closure weak on the right relative to the left. Brow furrowing is equal bilaterally.  VIII: No hyperacusis. Hearing to pen click is symmetric bilaterally  IX,X: Palate rises symmetrically XI: Symmetric XII: midline tongue extension Motor: Right : Upper extremity   5/5    Left:     Upper extremity   5/5  Lower extremity   5/5     Lower extremity   5/5 No pronator drift Sensory: Temp and light touch intact throughout, bilaterally. No extinction. Deep Tendon Reflexes: 2+ and symmetric throughout Plantars: Right: downgoing   Left: downgoing Cerebellar: No ataxia with FNF or H-S bilaterally  Gait: Deferred   Lab Results: Basic Metabolic Panel: Recent Labs  Lab 04/22/19 2115  NA 138  K 3.7  CL 107  CO2 20*  GLUCOSE 102*  BUN 11  CREATININE 0.69  CALCIUM 9.0    CBC: Recent Labs  Lab 04/22/19 2115  WBC 7.2  NEUTROABS 4.2  HGB 11.8*  HCT 35.4*  MCV 88.5  PLT 235    Cardiac Enzymes: No results for input(s): CKTOTAL, CKMB, CKMBINDEX, TROPONINI in the last 168 hours.  Lipid Panel: No results for input(s): CHOL, TRIG, HDL, CHOLHDL, VLDL,  LDLCALC in the last 168 hours.  Imaging: No results found.  Assessment: 38 year old pregnant female with right sided Bell's palsy 1.  Exam reveals weakness of right eyelid closure and perioral weakness. Symptom of diminished taste is noted. There is no hyperacusis.  2. No findings outside of those localizable to peripheral right CN7 are present on neurological exam. Presentation is not consistent with stroke.   Recommendations: 1. Agree with Valacyclovir and prednisone treatment 2. Consider MRI brain versus 7-14 day follow up given that she has had symptoms of right facial pain and paresthesia, which are atypical for Bell's palsy and localize to CN5.  3. Follow up with OB-GYN and Neurology as outpatient.    Electronically signed: Dr. Kerney Elbe 04/23/2019, 3:53 AM

## 2019-04-23 NOTE — ED Notes (Signed)
Dr. Lindzen at bedside. 

## 2019-04-26 ENCOUNTER — Other Ambulatory Visit (HOSPITAL_COMMUNITY): Payer: Self-pay | Admitting: Obstetrics and Gynecology

## 2019-04-26 DIAGNOSIS — O26872 Cervical shortening, second trimester: Secondary | ICD-10-CM

## 2019-04-26 DIAGNOSIS — Z3A18 18 weeks gestation of pregnancy: Secondary | ICD-10-CM

## 2019-04-26 DIAGNOSIS — O3442 Maternal care for other abnormalities of cervix, second trimester: Secondary | ICD-10-CM

## 2019-04-28 ENCOUNTER — Encounter (HOSPITAL_COMMUNITY): Payer: Self-pay | Admitting: *Deleted

## 2019-05-03 ENCOUNTER — Other Ambulatory Visit: Payer: Self-pay | Admitting: Obstetrics and Gynecology

## 2019-05-03 ENCOUNTER — Ambulatory Visit (HOSPITAL_COMMUNITY)
Admission: RE | Admit: 2019-05-03 | Discharge: 2019-05-03 | Disposition: A | Discharge status: Home / Self Care | Payer: 59 | Source: Ambulatory Visit | Attending: Obstetrics and Gynecology | Admitting: Obstetrics and Gynecology

## 2019-05-03 ENCOUNTER — Other Ambulatory Visit: Payer: Self-pay

## 2019-05-03 ENCOUNTER — Ambulatory Visit (HOSPITAL_COMMUNITY): Payer: 59

## 2019-05-03 ENCOUNTER — Encounter (HOSPITAL_COMMUNITY): Payer: Self-pay

## 2019-05-03 ENCOUNTER — Ambulatory Visit (HOSPITAL_COMMUNITY): Payer: 59 | Admitting: *Deleted

## 2019-05-03 ENCOUNTER — Other Ambulatory Visit (HOSPITAL_COMMUNITY): Payer: Self-pay | Admitting: *Deleted

## 2019-05-03 VITALS — BP 120/83 | HR 93 | Temp 98.1°F

## 2019-05-03 DIAGNOSIS — O3442 Maternal care for other abnormalities of cervix, second trimester: Secondary | ICD-10-CM

## 2019-05-03 DIAGNOSIS — O3430 Maternal care for cervical incompetence, unspecified trimester: Secondary | ICD-10-CM

## 2019-05-03 DIAGNOSIS — Z3A18 18 weeks gestation of pregnancy: Secondary | ICD-10-CM

## 2019-05-03 DIAGNOSIS — O24410 Gestational diabetes mellitus in pregnancy, diet controlled: Secondary | ICD-10-CM | POA: Diagnosis not present

## 2019-05-03 DIAGNOSIS — O10912 Unspecified pre-existing hypertension complicating pregnancy, second trimester: Secondary | ICD-10-CM

## 2019-05-03 DIAGNOSIS — O26872 Cervical shortening, second trimester: Secondary | ICD-10-CM

## 2019-05-03 NOTE — Consult Note (Signed)
Maternal-Fetal Medicine  Name: Janet Bowen MRN: [id number] Requesting Provider: Servando Salina, MD  I had the pleasure of seeing Janet Bowen today at the [facility]. She is G1 P0 at 18-weeks' gestation. She is here for ultrasound and consultation.   On your office ultrasound performed on 04/23/19, the cervix measured 1.8 to 1.9 cm on transvaginal ultrasound. Patient has been taking vaginal progesterone since then. She does not have symptoms of pelvic pressure or vaginal bleeding.   Past surgical history is significant for cold-knife cone biopsy procedure performed 8 years ago for abnormal cervical cytology. This is her first pregnancy.  PMH: Chronic hypertension for 10 years and the patient is taking labetalol 100 mg bid and her blood pressures are reportedly well controlled. Patient also has gestational diabetes and is checking her blood glucose regularly. She reports her fasting levels are between 80 and 95 mg/Dl and her postprandial (2-h) levels are below 130 mg/dL. Diabetes is well-controlled on diet. She had a consultation with her endocrinologist. Of note, glucola was performed a few hours after she took first dose of prednisone that was prescribed for Bell's palsy. She does not have sickle-cell trait or any other chronic medical conditions. Medications: Prenatal vitamins, aspirin 81 mg daily, vaginal progesterone, labetalol. Allergies: NKDA.Marland Kitchen Social: Denies tobacco or drug or alcohol use.  She is an ex-smoker. Her partner is [place] Optometrist and he is in good health. Family: No history of venous thromboembolism in the family.  Prenatal course: On cell-free fetal DNA screening, the risk of fetal aneuploidies are not increased.  Ultrasound: A limited ultrasound study was performed. Amniotic fluid is normal and good fetal activity is seen. An intramural myoma, measuring 6.6 x 6.8 x 6.5 cm, is seen in the fundal region. Patient does not have symptoms pertaining to  the myoma. On transabdominal scan, cervical canal was dilated proximally (funneling).  We performed transvaginal ultrasound to evaluate the cervix. Funneling was seen. The residual-closed portion of the cervix measures 8 millimeters. No further shortening was seen on transfundal pressure.   After explaining, I performed sterile-speculum examination. Cervix and vagina appear health. External os is closed. Digital examination showed the cervix to be about 1.5 cm long and external os is closed.  BP at our office: 120/83 mm Hg.  I counseled the patient on the following:  Cervical incompetence: Patient has cervical incompetence. I explained the findings with help of ultrasound images and diagrams. Her past surgical history of cone biopsy is a high-risk factor for cervical incompetence. While prophylactic cerclage is not indicated in women with history of cold-knife conization, failure of progesterone treatment or significant shortening of cervix (under 10 millimeters) is a strong indication for rescue cerclage.  I recommended rescue cerclage. I explained the procedure and possible complications including miscarriage, infection, bleeding and injuries to bladder or bowel (all rare). I also informed her that cerclage does not guarantee carrying pregnancy to term and the chances of preterm delivery is still high.  After counseling, the patient opted to have rescue cerclage.  Chronic hypertension:  I discussed the importance of taking antihypertensives mainly to prevent maternal complications including stroke. Superimposed preeclampsia occurs in about 30% of cases. Other complications include fetal growth restriction, placental abruption and increased likelihood of cesarean delivery.  I discussed the benefit of low-dose aspirin prophylaxis in delaying or preventing preeclampsia.  Gestational diabetes: We briefly discussed gestational diabetes and management. Ultrasound protocol was also discussed.  Patient seems very motivated and checks her blood glucose regularly.  I discussed the findings and recommendations with Dr. Garwin Brothers.  Recommendations: -Rescue cerclage tomorrow. Dr. Garwin Brothers will make OR arrangements and her office will call the patient.  -Follow-up cervical length measurement at our office next week. -Detailed fetal anatomical survey at 20 weeks. -Serial fetal growth assessments every 4 weeks. -Weekly antenatal testing from 32 to 34 weeks till delivery. -Cerclage removal at 37 weeks. -Delivery at 39 weeks. -Continue follow-up with endocrinologist as needed. -HbA1C estimation is useful in identifying type 2 diabetes (greater than 6.5%). Fetal echocardiography if HbA1c is greater than 6.5%.   Thank you for your consult. Please do not hesitate to contact me if you have any questions or concerns.  Consultation including face-to-face counseling: 40 min.

## 2019-05-04 ENCOUNTER — Other Ambulatory Visit: Payer: Self-pay

## 2019-05-04 ENCOUNTER — Encounter (HOSPITAL_COMMUNITY)
Admission: RE | Disposition: A | Discharge status: Home / Self Care | Payer: Self-pay | Source: Home / Self Care | Attending: Obstetrics and Gynecology

## 2019-05-04 ENCOUNTER — Ambulatory Visit (HOSPITAL_COMMUNITY): Payer: 59

## 2019-05-04 ENCOUNTER — Encounter (HOSPITAL_COMMUNITY): Payer: Self-pay | Admitting: *Deleted

## 2019-05-04 ENCOUNTER — Ambulatory Visit (HOSPITAL_COMMUNITY): Payer: 59 | Admitting: Anesthesiology

## 2019-05-04 ENCOUNTER — Ambulatory Visit (HOSPITAL_COMMUNITY)
Admission: RE | Admit: 2019-05-04 | Discharge: 2019-05-04 | Disposition: A | Discharge status: Home / Self Care | Payer: 59 | Attending: Obstetrics and Gynecology | Admitting: Obstetrics and Gynecology

## 2019-05-04 DIAGNOSIS — Z3A18 18 weeks gestation of pregnancy: Secondary | ICD-10-CM | POA: Diagnosis not present

## 2019-05-04 DIAGNOSIS — O10912 Unspecified pre-existing hypertension complicating pregnancy, second trimester: Secondary | ICD-10-CM | POA: Insufficient documentation

## 2019-05-04 DIAGNOSIS — D259 Leiomyoma of uterus, unspecified: Secondary | ICD-10-CM | POA: Insufficient documentation

## 2019-05-04 DIAGNOSIS — O99212 Obesity complicating pregnancy, second trimester: Secondary | ICD-10-CM | POA: Insufficient documentation

## 2019-05-04 DIAGNOSIS — O24410 Gestational diabetes mellitus in pregnancy, diet controlled: Secondary | ICD-10-CM | POA: Diagnosis not present

## 2019-05-04 DIAGNOSIS — O3432 Maternal care for cervical incompetence, second trimester: Secondary | ICD-10-CM | POA: Diagnosis not present

## 2019-05-04 DIAGNOSIS — Z8249 Family history of ischemic heart disease and other diseases of the circulatory system: Secondary | ICD-10-CM | POA: Diagnosis not present

## 2019-05-04 DIAGNOSIS — O3412 Maternal care for benign tumor of corpus uteri, second trimester: Secondary | ICD-10-CM | POA: Diagnosis not present

## 2019-05-04 DIAGNOSIS — O99332 Smoking (tobacco) complicating pregnancy, second trimester: Secondary | ICD-10-CM | POA: Insufficient documentation

## 2019-05-04 DIAGNOSIS — Z1159 Encounter for screening for other viral diseases: Secondary | ICD-10-CM | POA: Diagnosis not present

## 2019-05-04 DIAGNOSIS — F17210 Nicotine dependence, cigarettes, uncomplicated: Secondary | ICD-10-CM | POA: Diagnosis not present

## 2019-05-04 HISTORY — PX: CERVICAL CERCLAGE: SHX1329

## 2019-05-04 LAB — GLUCOSE, CAPILLARY
Glucose-Capillary: 86 mg/dL (ref 70–99)
Glucose-Capillary: 68 mg/dL — ABNORMAL LOW (ref 70–99)
Glucose-Capillary: 110 mg/dL — ABNORMAL HIGH (ref 70–99)

## 2019-05-04 LAB — CBC
HCT: 34.5 % — ABNORMAL LOW (ref 36.0–46.0)
Hemoglobin: 11.5 g/dL — ABNORMAL LOW (ref 12.0–15.0)
MCH: 29.3 pg (ref 26.0–34.0)
MCHC: 33.3 g/dL (ref 30.0–36.0)
MCV: 87.8 fL (ref 80.0–100.0)
Platelets: 222 10*3/uL (ref 150–400)
RBC: 3.93 MIL/uL (ref 3.87–5.11)
RDW: 13.8 % (ref 11.5–15.5)
WBC: 6.6 10*3/uL (ref 4.0–10.5)
nRBC: 0 % (ref 0.0–0.2)

## 2019-05-04 LAB — ABO/RH: ABO/RH(D): B POS

## 2019-05-04 LAB — TYPE AND SCREEN
ABO/RH(D): B POS
Antibody Screen: NEGATIVE

## 2019-05-04 LAB — SARS CORONAVIRUS 2 BY RT PCR (HOSPITAL ORDER, PERFORMED IN ~~LOC~~ HOSPITAL LAB): SARS Coronavirus 2: NEGATIVE

## 2019-05-04 SURGERY — CERCLAGE, CERVIX, VAGINAL APPROACH
Anesthesia: General

## 2019-05-04 SURGERY — CERCLAGE, CERVIX, VAGINAL APPROACH
Anesthesia: Choice

## 2019-05-04 SURGERY — CERCLAGE, CERVIX, VAGINAL APPROACH
Anesthesia: Spinal | Site: Vagina | Wound class: Clean Contaminated

## 2019-05-04 MED ORDER — ONDANSETRON HCL 4 MG/2ML IJ SOLN
INTRAMUSCULAR | Status: AC
  Filled 2019-05-04: qty 2

## 2019-05-04 MED ORDER — PHENYLEPHRINE HCL (PRESSORS) 10 MG/ML IV SOLN
INTRAVENOUS | Status: DC | PRN
  Administered 2019-05-04: 80 ug via INTRAVENOUS

## 2019-05-04 MED ORDER — INDOMETHACIN 50 MG RE SUPP
50.0000 mg | Freq: Once | RECTAL | Status: DC
  Filled 2019-05-04: qty 1

## 2019-05-04 MED ORDER — LACTATED RINGERS IV SOLN
INTRAVENOUS | Status: DC
  Administered 2019-05-04: 08:00:00 via INTRAVENOUS

## 2019-05-04 MED ORDER — ONDANSETRON HCL 4 MG/2ML IJ SOLN
INTRAMUSCULAR | Status: DC | PRN
  Administered 2019-05-04: 4 mg via INTRAVENOUS

## 2019-05-04 MED ORDER — SODIUM CHLORIDE 0.9 % IR SOLN
Status: DC | PRN
  Administered 2019-05-04: 1000 mL

## 2019-05-04 MED ORDER — SODIUM CHLORIDE 0.9 % IV SOLN
2.0000 g | Freq: Once | INTRAVENOUS | Status: AC
  Administered 2019-05-04: 2 g via INTRAVENOUS

## 2019-05-04 MED ORDER — BUPIVACAINE IN DEXTROSE 0.75-8.25 % IT SOLN
INTRATHECAL | Status: DC | PRN
  Administered 2019-05-04: 1.2 mL via INTRATHECAL

## 2019-05-04 MED ORDER — INDOMETHACIN 50 MG RE SUPP
RECTAL | Status: DC | PRN
  Administered 2019-05-04: 50 mg via RECTAL

## 2019-05-04 MED ORDER — FENTANYL CITRATE (PF) 100 MCG/2ML IJ SOLN: 25.0000 ug | INTRAMUSCULAR | Status: DC | PRN

## 2019-05-04 MED ORDER — INDOMETHACIN 25 MG PO CAPS: 25.0000 mg | ORAL_CAPSULE | Freq: Four times a day (QID) | ORAL | 0 refills | Status: AC

## 2019-05-04 MED ORDER — SODIUM CHLORIDE 0.9 % IV SOLN
INTRAVENOUS | Status: AC
  Filled 2019-05-04: qty 2000

## 2019-05-04 MED ORDER — PHENYLEPHRINE 40 MCG/ML (10ML) SYRINGE FOR IV PUSH (FOR BLOOD PRESSURE SUPPORT)
PREFILLED_SYRINGE | INTRAVENOUS | Status: AC
  Filled 2019-05-04: qty 10

## 2019-05-04 SURGICAL SUPPLY — 19 items
CANISTER SUCT 3000ML PPV (MISCELLANEOUS) ×2 IMPLANT
ELECT REM PT RETURN 9FT ADLT (ELECTROSURGICAL) ×2
ELECTRODE REM PT RTRN 9FT ADLT (ELECTROSURGICAL) ×1 IMPLANT
GLOVE BIO SURGEON STRL SZ7.5 (GLOVE) ×2 IMPLANT
GLOVE BIOGEL PI IND STRL 8 (GLOVE) ×1 IMPLANT
GLOVE BIOGEL PI INDICATOR 8 (GLOVE) ×1
GOWN STRL REUS W/ TWL LRG LVL3 (GOWN DISPOSABLE) ×2 IMPLANT
GOWN STRL REUS W/TWL LRG LVL3 (GOWN DISPOSABLE) ×4
HIBICLENS CHG 4% 4OZ BTL (MISCELLANEOUS) ×2 IMPLANT
PACK VAGINAL MINOR WOMEN LF (CUSTOM PROCEDURE TRAY) ×2 IMPLANT
PAD OB MATERNITY 4.3X12.25 (PERSONAL CARE ITEMS) ×2 IMPLANT
PAD PREP 24X48 CUFFED NSTRL (MISCELLANEOUS) ×2 IMPLANT
PENCIL BUTTON HOLSTER BLD 10FT (ELECTRODE) ×2 IMPLANT
SUT MERSILENE FIBER S 5 MO-4 1 (SUTURE) ×2 IMPLANT
SYR BULB IRRIGATION 50ML (SYRINGE) IMPLANT
TOWEL OR 17X24 6PK STRL BLUE (TOWEL DISPOSABLE) ×4 IMPLANT
TRAY FOLEY W/BAG SLVR 14FR (SET/KITS/TRAYS/PACK) ×2 IMPLANT
TUBING NON-CON 1/4 X 20 CONN (TUBING) ×2 IMPLANT
YANKAUER SUCT BULB TIP NO VENT (SUCTIONS) ×2 IMPLANT

## 2019-05-04 NOTE — Op Note (Addendum)
   PATIENT:  Janet Bowen  38 y.o. female  PRE-OPERATIVE DIAGNOSIS: 18-weeks' gestation with cervical incompetence  POST-OPERATIVE DIAGNOSIS:  Same with rescue cerclage.  RE:  Procedure(s): CERCLAGE CERVICAL (N/A)  SURGEON:  Surgeon(s) and Role:    * Tama High, MD - Primary    * Servando Salina, MD   After informed consent, the patient was taken to the OR. After spinal anesthesia, the patient was prepped and draped in dorsal lithotomy position. A sterile weighted speculum was inserted. The cervix was 0.5 centimeters long and irregular possibly because of previous cone biopsy.The anterior lip of the cervix was held with a ring forceps.   With help of Bovie, a small incision of about 2 cm was made at the cervicovesical junction and the bladder was pushed up. A Mersilene 5 tape stitch was placed circumferentially around the cervix as high as possible and the final stitch exited close to 11 O'Clock position. Five secure knots were placed anteriorly. The external os was closed.  Vagina was inspected and no lacerations were seen. Complete hemostasis was achieved. Foley catheter that was placed before the procedure showed clear urine. Rectal examination revealed no inadvertent sutures.   Counts correct. Estimated blood loss: 50 milliliters.   I reassured the patient of successful procedure. Patient can iscontinue taking vaginal progesterone. Patient left the operating room in stable condition.

## 2019-05-04 NOTE — Discharge Instructions (Signed)
Pt verbalizes understanding of all discharge instuctions

## 2019-05-04 NOTE — Anesthesia Procedure Notes (Signed)
Spinal  Patient location during procedure: OR Staffing Anesthesiologist: Montez Hageman, MD Performed: anesthesiologist  Preanesthetic Checklist Completed: patient identified, site marked, surgical consent, pre-op evaluation, timeout performed, IV checked, risks and benefits discussed and monitors and equipment checked Spinal Block Patient position: sitting Prep: DuraPrep Patient monitoring: heart rate, continuous pulse ox and blood pressure Approach: midline Location: L4-5 Injection technique: single-shot Needle Needle type: Sprotte  Needle gauge: 24 G Needle length: 9 cm Additional Notes Expiration date of kit checked and confirmed. Patient tolerated procedure well, without complications.

## 2019-05-04 NOTE — Brief Op Note (Signed)
05/04/2019  8:18 AM  PATIENT:  Janet Bowen  38 y.o. female  PRE-OPERATIVE DIAGNOSIS: 18-weeks' gestation with cervical incompetence  POST-OPERATIVE DIAGNOSIS:  Same with rescue cerclage.  RE:  Procedure(s): CERCLAGE CERVICAL (N/A)  SURGEON:  Surgeon(s) and Role:    * Tama High, MD - Primary    * Servando Salina, MD  PHYSICIAN ASSISTANT:   ASSISTANTS: none   ANESTHESIA:   spinal  EBL:  50 milliliters  BLOOD ADMINISTERED:none  DRAINS: none   LOCAL MEDICATIONS USED:  NONE  SPECIMEN:  No Specimen  DISPOSITION OF SPECIMEN:  N/A  COUNTS:  YES  TOURNIQUET:  * No tourniquets in log *  DICTATION: .Note written in EPIC  PLAN OF CARE: Discharge to home after PACU  PATIENT DISPOSITION:  PACU - hemodynamically stable.   Delay start of Pharmacological VTE agent (>24hrs) due to surgical blood loss or risk of bleeding: not applicable

## 2019-05-04 NOTE — H&P (Signed)
Janet Bowen is a 38 y.o. female presenting @ 18 1/7 week for rescue cervical cerclage due to cervical incompetence related to hx conization. Pt has been on prometrium with worsening shortening of cervix.  PNC complicated by chronic HTN, newly dx GDM, fibroid uterus, AMA OB History    Gravida  1   Para  0   Term  0   Preterm  0   AB  0   Living  0     SAB  0   TAB  0   Ectopic  0   Multiple  0   Live Births             Past Medical History:  Diagnosis Date  . Bell's palsy   . Fibroid   . Gestational diabetes   . Hirsutism   . Hyperprolactinemia ([place])   . Hypertension   . Obesity   . PCOS (polycystic ovarian syndrome)    Dr. Simona Huh (GYN)   Past Surgical History:  Procedure Laterality Date  . CERVICAL CONE BIOPSY  04-08-2008  . COLPOSCOPY  03-15-2008   Family History: family history includes Cerebral aneurysm in her sister; Diabetes in her maternal aunt; Hyperlipidemia in her father; Hypertension in her father; Multiple myeloma in her maternal grandmother. Social History:  reports that she has been smoking cigarettes. She has a 7.50 pack-year smoking history. She has never used smokeless tobacco. She reports previous drug use. Drug: Marijuana. She reports that she does not drink alcohol.     Maternal Diabetes: Yes:  Diabetes Type:  Diet controlled Genetic Screening: Normal Maternal Ultrasounds/Referrals: Normal Fetal Ultrasounds or other Referrals:  None Maternal Substance Abuse:  No Significant Maternal Medications:  Meds include: Other: labetalol, baby asa Significant Maternal Lab Results:  None Other Comments:  see HPI  Review of Systems  All other systems reviewed and are negative.  History   Blood pressure 116/76, pulse 90, temperature 98.5 F (36.9 C), temperature source Oral, resp. rate 20, height '5\' 7"'$  (1.702 m), weight 104.1 kg, last menstrual period 12/28/2018, SpO2 100 %. Exam Physical Exam  Constitutional: She is oriented to person,  place, and time. She appears well-developed and well-nourished.  HENT:  Head: Atraumatic.  Eyes: EOM are normal.  Neck: Neck supple.  Cardiovascular: Regular rhythm.  Respiratory: Breath sounds normal.  GI:  gravid  Musculoskeletal:        General: No edema.  Neurological: She is alert and oriented to person, place, and time.  Skin: Skin is warm and dry.  Psychiatric: She has a normal mood and affect.    Prenatal labs: ABO, Rh: --/--/B POS (06/30 0520) Antibody: NEG (06/30 0520) Rubella:  Immune RPR:   NR HBsAg:   neg HIV:   neg GBS:   unknown [place] Mfm Ob Transvaginal  Result Date: 05/03/2019 ----------------------------------------------------------------------  OBSTETRICS REPORT                       (Signed Final 05/03/2019 10:00 am) ---------------------------------------------------------------------- Patient Info  ID #:       [id number]                          D.O.B.:  10-05-1981 (38 yrs)  Name:       Janet Bowen                Visit Date: 05/03/2019 07:48 am ---------------------------------------------------------------------- Performed By  Performed By:     Christen Bame  Vics             Ref. Address:     [address]                                                             [place], [place]  Attending:        Tama High MD        [facility]  Referred By:      Alanda Slim                    Leyana Whidden MD ---------------------------------------------------------------------- Orders   #  Description                          Code         Ordered By   1  [place] MFM OB TRANSVAGINAL                [phone]      [facility]  Landra Howze  ----------------------------------------------------------------------   #  Order #                    Accession #                 Episode #   1  [id number]                  [id number]                  [id number]  ---------------------------------------------------------------------- Indications   Cervical shortening complicating pregnancy     O26.879   [redacted] weeks gestation of pregnancy                M2U.63   Obesity complicating pregnancy, second         O99.212   trimester (Pre Pregnancy BMI 37.95)   Gestational diabetes in pregnancy, diet        O24.410   controlled   Hypertension - Chronic/Pre-existing            O10.019   (labatalol)   Advanced maternal age primigravida 82+,        O18.512   second trimester   Uterine fibroids                               O34.10  ---------------------------------------------------------------------- Vital Signs  Weight (lb): 233                               Height:        5'6"  BMI:         37.6 ---------------------------------------------------------------------- Fetal Evaluation  Num Of Fetuses:         1  Fetal Heart Rate(bpm):  149  Cardiac Activity:       Observed  Presentation:           Cephalic  Placenta:               Posterior  Amniotic Fluid  AFI FV:      Within normal limits                              Largest Pocket(cm)                              5.17 ---------------------------------------------------------------------- OB History  Gravidity:    1 ---------------------------------------------------------------------- Gestational Age  LMP:           18w 0d        Date:  12/28/18                 EDD:   10/04/19  Best:          Renae Fickle 0d     Det. By:  LMP  (12/28/18)          EDD:   10/04/19 ---------------------------------------------------------------------- Cervix Uterus Adnexa  Cervix  Length:            0.8  cm.  Funneling of internal os noted. Measured transvaginally.   Uterus  Single fibroid noted, see table below. ---------------------------------------------------------------------- Myomas   Site                     L(cm)      W(cm)  D(cm)      Location   Fundus                   6.6        6.8        6.5  ----------------------------------------------------------------------   Blood Flow                 RI        PI       Comments  ---------------------------------------------------------------------- Impression  A limited ultrasound study was performed. Amniotic fluid is  normal and good fetal activity is seen. An intramural myoma,  measuring 6.6 x 6.8 x 6.5 cm, is seen in the fundal region.  Patient does not have symptoms pertaining to the myoma.  On transabdominal scan, cervical canal was dilated  proximally (funneling).  We performed transvaginal ultrasound to evaluate the cervix.  Funneling was seen. The residual-closed portion of the cervix  measures 8 millimeters. No further shortening was seen on  transfundal pressure.  After explaining, I performed sterile-speculum examination.  Cervix and vagina appear health. External os is closed.  Digital examination showed the cervix to be about 1.5 cm  long and external os is closed.  BP at our office: 120/83 mm Hg.  xxxxxxxxxxxxxxxxxxxxxxxxxxxxxxxxxxxxxxxxxxxxxxxxxxxxxxxxx  xxxxxxxxxxxx  Consultation Note Copied from [place]  Name: Zyion Leidner  MRN: [id number]  Requesting Provider: Servando Salina, MD  I had the pleasure of seeing Ms. Egelston today at the [facility]. She is G1 P0 at 18-weeks' gestation.  She is here for ultrasound and consultation.  On your office ultrasound performed on 04/23/19, the cervix  measured 1.8 to 1.9 cm on transvaginal ultrasound. Patient  has been taking vaginal progesterone since then. She does  not have symptoms of pelvic pressure or vaginal bleeding.  Past surgical history is significant for cold-knife cone biopsy  procedure performed 8 years ago  for abnormal cervical  cytology. This is her first pregnancy.  PMH: Chronic hypertension for 10 years and the patient is  taking labetalol 100 mg bid and her blood pressures are  reportedly well controlled.  Patient also has gestational diabetes and is checking her  blood glucose regularly. She reports her fasting levels are  between 80 and 95 mg/Dl and her postprandial (2-h) levels  are below 130 mg/dL. Diabetes is well-controlled on diet. She  had a consultation with her endocrinologist.  Of note, glucola was performed a few hours after she took  first dose of prednisone that was prescribed for Bell's palsy.  She does not have sickle-cell trait or any other chronic  medical conditions.  Medications: Prenatal vitamins, aspirin 81 mg daily, vaginal  progesterone, labetalol.  Allergies: NKDA.Marland Kitchen  Social: Denies tobacco or drug or alcohol use.  She is an ex-  smoker. Her partner is [place] [employer] and he is in good  health.  Family: No history of venous thromboembolism in the family.  Prenatal course: On cell-free fetal DNA screening, the risk of  fetal aneuploidies are not increased.  I counseled the patient on the following:  Cervical incompetence:  Patient has cervical incompetence. I explained the findings  with help of ultrasound images and diagrams. Her past  surgical history of cone biopsy is a high-risk factor for cervical  incompetence. While prophylactic cerclage is not indicated in  women with history of cold-knife conization, failure of  progesterone treatment or significant shortening of cervix  (under 10 millimeters) is a strong  indication for rescue  cerclage.  I recommended rescue cerclage. I explained the procedure  and possible complications including miscarriage, infection,  bleeding and injuries to bladder or bowel (all rare). I also  informed her that cerclage does not guarantee carrying  pregnancy to term and the chances of preterm delivery is still  high.  After counseling, the patient opted to  have rescue cerclage.  Chronic hypertension:  I discussed the importance of taking antihypertensives mainly  to prevent maternal complications including stroke.  Superimposed preeclampsia occurs in about 30% of cases.  Other complications include fetal growth restriction, placental  abruption and increased likelihood of cesarean delivery.  I discussed the benefit of low-dose aspirin prophylaxis in  delaying or preventing preeclampsia.  Gestational diabetes:  We briefly discussed gestational diabetes and management.  Ultrasound protocol was also discussed. Patient seems very  motivated and checks her blood glucose regularly.  I discussed the findings and recommendations with Dr.  Garwin Brothers. ---------------------------------------------------------------------- Recommendations  -Rescue cerclage tomorrow. Dr. Garwin Brothers will make OR  arrangements and her office will call the patient.  -Follow-up cervical length measurement at our office next  week.  -Detailed fetal anatomical survey at 20 weeks.  -Serial fetal growth assessments every 4 weeks.  -Weekly antenatal testing from 32 to 34 weeks till delivery.  -Cerclage removal at 37 weeks.  -Delivery at 39 weeks.  -Continue follow-up with endocrinologist as needed.  -HbA1C estimation is useful in identifying type 2 diabetes  (greater than 6.5%). Fetal echocardiography if HbA1c is  greater than 6.5%. ----------------------------------------------------------------------                  Tama High, MD Electronically Signed Final Report   05/03/2019 10:00 am ----------------------------------------------------------------------  Assessment/Plan: Cervical incompetence Class  A GDM Hx conization Morbid obesity  uterine fibroid affecting pregnancy IUP @ 18 1/7 weeks Chronic HTN on med P) rescue cervical cerclage   Derin Matthes A Jazmin Ley 05/04/2019, 6:33 AM

## 2019-05-04 NOTE — Anesthesia Preprocedure Evaluation (Signed)
Anesthesia Evaluation  Patient identified by MRN, date of birth, ID band Patient awake    Reviewed: Allergy & Precautions, NPO status , Patient's Chart, lab work & pertinent test results  Airway Mallampati: II  TM Distance: >3 FB Neck ROM: Full    Dental no notable dental hx.    Pulmonary neg pulmonary ROS, Current Smoker,    Pulmonary exam normal breath sounds clear to auscultation       Cardiovascular hypertension, Pt. on medications Normal cardiovascular exam Rhythm:Regular Rate:Normal     Neuro/Psych negative neurological ROS  negative psych ROS   GI/Hepatic negative GI ROS, Neg liver ROS,   Endo/Other  diabetes  Renal/GU negative Renal ROS  negative genitourinary   Musculoskeletal negative musculoskeletal ROS (+)   Abdominal   Peds negative pediatric ROS (+)  Hematology negative hematology ROS (+)   Anesthesia Other Findings   Reproductive/Obstetrics (+) Pregnancy                             Anesthesia Physical Anesthesia Plan  ASA: II  Anesthesia Plan: Spinal   Post-op Pain Management:    Induction:   PONV Risk Score and Plan: 1 and Treatment may vary due to age or medical condition  Airway Management Planned: Natural Airway  Additional Equipment:   Intra-op Plan:   Post-operative Plan:   Informed Consent: I have reviewed the patients History and Physical, chart, labs and discussed the procedure including the risks, benefits and alternatives for the proposed anesthesia with the patient or authorized representative who has indicated his/her understanding and acceptance.     Dental advisory given  Plan Discussed with:   Anesthesia Plan Comments: (Spinal, no sedation, no unnecessary meds.)        Anesthesia Quick Evaluation

## 2019-05-04 NOTE — Anesthesia Postprocedure Evaluation (Signed)
Anesthesia Post Note  Patient: Janet Bowen  Procedure(s) Performed: CERCLAGE CERVICAL (N/A Vagina )     Patient location during evaluation: PACU Anesthesia Type: Spinal Level of consciousness: awake and alert Pain management: pain level controlled Vital Signs Assessment: post-procedure vital signs reviewed and stable Respiratory status: spontaneous breathing and respiratory function stable Cardiovascular status: blood pressure returned to baseline and stable Postop Assessment: no headache, no backache, spinal receding and no apparent nausea or vomiting Anesthetic complications: no    Last Vitals:  Vitals:   05/04/19 0943 05/04/19 0945  BP:  113/67  Pulse:    Resp: 14 11  Temp: 36.9 C   SpO2:      Last Pain:  Vitals:   05/04/19 0943  TempSrc: Oral  PainSc:    Pain Goal:                Epidural/Spinal Function Cutaneous sensation: Tingles (05/04/19 0941), Patient able to flex knees: Yes (05/04/19 0941), Patient able to lift hips off bed: No (05/04/19 0941), Back pain beyond tenderness at insertion site: No (05/04/19 0941), Progressively worsening motor and/or sensory loss: No (05/04/19 0941), Bowel and/or bladder incontinence post epidural: No (05/04/19 0941)  Montez Hageman

## 2019-05-04 NOTE — Transfer of Care (Signed)
Immediate Anesthesia Transfer of Care Note  Patient: Janet Bowen  Procedure(s) Performed: CERCLAGE CERVICAL (N/A Vagina )  Patient Location: PACU  Anesthesia Type:Spinal  Level of Consciousness: awake, alert  and oriented  Airway & Oxygen Therapy: Patient Spontanous Breathing and Patient connected to nasal cannula oxygen  Post-op Assessment: Report given to RN and Post -op Vital signs reviewed and stable  Post vital signs: Reviewed and stable  Last Vitals:  Vitals Value Taken Time  BP 104/68 05/04/19 0818  Temp    Pulse 79 05/04/19 0819  Resp 23 05/04/19 0819  SpO2 100 % 05/04/19 0819  Vitals shown include unvalidated device data.  Last Pain:  Vitals:   05/04/19 0510  TempSrc: Oral         Complications: No apparent anesthesia complications

## 2019-05-05 ENCOUNTER — Inpatient Hospital Stay (HOSPITAL_COMMUNITY)
Admission: EM | Admit: 2019-05-05 | Discharge: 2019-05-06 | Disposition: A | Discharge status: Home / Self Care | Payer: 59 | Attending: Obstetrics and Gynecology | Admitting: Obstetrics and Gynecology

## 2019-05-05 ENCOUNTER — Other Ambulatory Visit: Payer: Self-pay

## 2019-05-05 DIAGNOSIS — Z7982 Long term (current) use of aspirin: Secondary | ICD-10-CM | POA: Insufficient documentation

## 2019-05-05 DIAGNOSIS — O99212 Obesity complicating pregnancy, second trimester: Secondary | ICD-10-CM | POA: Insufficient documentation

## 2019-05-05 DIAGNOSIS — O3432 Maternal care for cervical incompetence, second trimester: Secondary | ICD-10-CM | POA: Insufficient documentation

## 2019-05-05 DIAGNOSIS — F17210 Nicotine dependence, cigarettes, uncomplicated: Secondary | ICD-10-CM | POA: Insufficient documentation

## 2019-05-05 DIAGNOSIS — K5900 Constipation, unspecified: Secondary | ICD-10-CM | POA: Insufficient documentation

## 2019-05-05 DIAGNOSIS — O99612 Diseases of the digestive system complicating pregnancy, second trimester: Secondary | ICD-10-CM | POA: Insufficient documentation

## 2019-05-05 DIAGNOSIS — Z3A18 18 weeks gestation of pregnancy: Secondary | ICD-10-CM | POA: Insufficient documentation

## 2019-05-05 DIAGNOSIS — K5902 Outlet dysfunction constipation: Secondary | ICD-10-CM

## 2019-05-05 DIAGNOSIS — N9989 Other postprocedural complications and disorders of genitourinary system: Secondary | ICD-10-CM

## 2019-05-05 LAB — URINALYSIS, ROUTINE W REFLEX MICROSCOPIC
Bacteria, UA: NONE SEEN
Bilirubin Urine: NEGATIVE
Glucose, UA: NEGATIVE mg/dL
Ketones, ur: 20 mg/dL — AB
Leukocytes,Ua: NEGATIVE
Nitrite: NEGATIVE
Protein, ur: NEGATIVE mg/dL
Specific Gravity, Urine: 1.014 (ref 1.005–1.030)
pH: 6 (ref 5.0–8.0)

## 2019-05-05 MED ORDER — POLYETHYLENE GLYCOL 3350 17 G PO PACK
17.0000 g | PACK | Freq: Once | ORAL | Status: AC
  Administered 2019-05-06: 17 g via ORAL
  Filled 2019-05-05: qty 1

## 2019-05-05 NOTE — MAU Note (Signed)
Pt reports to MAU c/o unable to urinate since 1500. Pt is also constipated. Pt denies abdominal pain or bleeding. Just pain 10/10 in her bladder.

## 2019-05-05 NOTE — MAU Note (Signed)
I&O Cath preformed with Hansel Feinstein at bedside. 850 cc collected cath UA sent.

## 2019-05-05 NOTE — MAU Provider Note (Signed)
History     Chief Complaint  Patient presents with  . Dysuria  . Constipation   38 yo G1P0 BF s/p rescue cervical cerclage 6/30 presents @ 18 1/7 wk with c/o inability to void and bladder pain. Pt reports voiding well until ~3 pm today. Pt also noted bowel movements have been small and have been straining  OB History    Gravida  1   Para  0   Term  0   Preterm  0   AB  0   Living  0     SAB  0   TAB  0   Ectopic  0   Multiple  0   Live Births              Past Medical History:  Diagnosis Date  . Bell's palsy   . Fibroid   . Gestational diabetes   . Hirsutism   . Hyperprolactinemia ([place])   . Hypertension   . Obesity   . PCOS (polycystic ovarian syndrome)    Dr. Simona Huh (GYN)    Past Surgical History:  Procedure Laterality Date  . CERVICAL CERCLAGE N/A 05/04/2019   Procedure: CERCLAGE CERVICAL;  Surgeon: Tama High, MD;  Location: [facility];  Service: Gynecology;  Laterality: N/A;  . CERVICAL CONE BIOPSY  04-08-2008  . COLPOSCOPY  03-15-2008    Family History  Problem Relation Age of Onset  . Hypertension Father   . Hyperlipidemia Father   . Cerebral aneurysm Sister   . Diabetes Maternal Aunt   . Multiple myeloma Maternal Grandmother   . Stroke Neg Hx     Social History   Tobacco Use  . Smoking status: Current Some Day Smoker    Packs/day: 0.50    Years: 15.00    Pack years: 7.50    Types: Cigarettes    Last attempt to quit: 11/05/2011    Years since quitting: 7.5  . Smokeless tobacco: Never Used  Substance Use Topics  . Alcohol use: No    Alcohol/week: 0.0 standard drinks  . Drug use: Not Currently    Types: Marijuana    Comment: marijuana, 2 x per week.    Allergies: No Known Allergies  Medications Prior to Admission  Medication Sig Dispense Refill Last Dose  . aspirin EC 81 MG tablet Take 81 mg by mouth daily.     . indomethacin (INDOCIN) 25 MG capsule Take 1 capsule (25 mg total) by mouth 4 (four) times daily for 2 days. 8  capsule 0   . labetalol (NORMODYNE) 100 MG tablet Take 1 tablet (100 mg total) by mouth 2 (two) times daily. Need annual visit for further refills 180 tablet 0   . Prenatal Vit-Fe Fumarate-FA (PRENATABS FA) TABS Take 1 tablet by mouth daily.   12      Physical Exam   Last menstrual period 12/28/2018.  General appearance: alert, cooperative and no distress Abdomen: gravid Pelvic: external genitalia normal and large stool in vault Extremities: no edema, redness or tenderness in the calves or thighs   Urine cath for 858m ED Course  IMP: urinary retention Constipation Cervical incompetence IUP@ 18 1/7 P)  I&O cath. U/a sent ,soap sud enema. Miralax now and start daily. Instructed to not strain. Keep sched OB appt. D/c home MDM   SMarvene Staff MD 11:52 PM 05/05/2019

## 2019-05-06 ENCOUNTER — Other Ambulatory Visit (HOSPITAL_COMMUNITY): Payer: Self-pay | Admitting: Obstetrics and Gynecology

## 2019-05-06 ENCOUNTER — Encounter (HOSPITAL_COMMUNITY): Payer: Self-pay | Admitting: *Deleted

## 2019-05-06 DIAGNOSIS — F17210 Nicotine dependence, cigarettes, uncomplicated: Secondary | ICD-10-CM | POA: Diagnosis not present

## 2019-05-06 DIAGNOSIS — R300 Dysuria: Secondary | ICD-10-CM | POA: Diagnosis present

## 2019-05-06 DIAGNOSIS — Z3A18 18 weeks gestation of pregnancy: Secondary | ICD-10-CM | POA: Diagnosis not present

## 2019-05-06 DIAGNOSIS — Z7982 Long term (current) use of aspirin: Secondary | ICD-10-CM | POA: Diagnosis not present

## 2019-05-06 DIAGNOSIS — O99612 Diseases of the digestive system complicating pregnancy, second trimester: Secondary | ICD-10-CM | POA: Diagnosis not present

## 2019-05-06 DIAGNOSIS — K5900 Constipation, unspecified: Secondary | ICD-10-CM | POA: Diagnosis not present

## 2019-05-06 DIAGNOSIS — O3432 Maternal care for cervical incompetence, second trimester: Secondary | ICD-10-CM | POA: Diagnosis not present

## 2019-05-06 DIAGNOSIS — O99212 Obesity complicating pregnancy, second trimester: Secondary | ICD-10-CM | POA: Diagnosis not present

## 2019-05-06 NOTE — MAU Note (Signed)
539ml given in soap suds enema. Pt unable to tolerate anymore.

## 2019-05-10 ENCOUNTER — Other Ambulatory Visit: Payer: Self-pay | Admitting: Internal Medicine

## 2019-05-11 ENCOUNTER — Other Ambulatory Visit: Payer: Self-pay

## 2019-05-11 ENCOUNTER — Other Ambulatory Visit (HOSPITAL_COMMUNITY): Payer: Self-pay | Admitting: *Deleted

## 2019-05-11 ENCOUNTER — Ambulatory Visit (HOSPITAL_COMMUNITY): Payer: 59 | Admitting: *Deleted

## 2019-05-11 ENCOUNTER — Ambulatory Visit (HOSPITAL_COMMUNITY)
Admission: RE | Admit: 2019-05-11 | Discharge: 2019-05-11 | Disposition: A | Discharge status: Home / Self Care | Payer: 59 | Source: Ambulatory Visit | Attending: Obstetrics and Gynecology | Admitting: Obstetrics and Gynecology

## 2019-05-11 ENCOUNTER — Encounter (HOSPITAL_COMMUNITY): Payer: Self-pay | Admitting: *Deleted

## 2019-05-11 VITALS — BP 108/77 | HR 87 | Temp 98.4°F

## 2019-05-11 DIAGNOSIS — O99212 Obesity complicating pregnancy, second trimester: Secondary | ICD-10-CM

## 2019-05-11 DIAGNOSIS — I10 Essential (primary) hypertension: Secondary | ICD-10-CM

## 2019-05-11 DIAGNOSIS — O24410 Gestational diabetes mellitus in pregnancy, diet controlled: Secondary | ICD-10-CM | POA: Diagnosis not present

## 2019-05-11 DIAGNOSIS — O3412 Maternal care for benign tumor of corpus uteri, second trimester: Secondary | ICD-10-CM

## 2019-05-11 DIAGNOSIS — O10012 Pre-existing essential hypertension complicating pregnancy, second trimester: Secondary | ICD-10-CM

## 2019-05-11 DIAGNOSIS — O3430 Maternal care for cervical incompetence, unspecified trimester: Secondary | ICD-10-CM | POA: Insufficient documentation

## 2019-05-11 DIAGNOSIS — O26872 Cervical shortening, second trimester: Secondary | ICD-10-CM | POA: Diagnosis not present

## 2019-05-11 DIAGNOSIS — O09512 Supervision of elderly primigravida, second trimester: Secondary | ICD-10-CM

## 2019-05-11 DIAGNOSIS — N883 Incompetence of cervix uteri: Secondary | ICD-10-CM

## 2019-05-11 DIAGNOSIS — Z3A19 19 weeks gestation of pregnancy: Secondary | ICD-10-CM

## 2019-05-21 ENCOUNTER — Other Ambulatory Visit: Payer: Self-pay | Admitting: Internal Medicine

## 2019-06-08 ENCOUNTER — Other Ambulatory Visit: Payer: Self-pay

## 2019-06-08 ENCOUNTER — Ambulatory Visit (HOSPITAL_COMMUNITY): Payer: 59 | Admitting: *Deleted

## 2019-06-08 ENCOUNTER — Ambulatory Visit (HOSPITAL_COMMUNITY)
Admission: RE | Admit: 2019-06-08 | Discharge: 2019-06-08 | Disposition: A | Discharge status: Home / Self Care | Payer: 59 | Source: Ambulatory Visit | Attending: Obstetrics and Gynecology | Admitting: Obstetrics and Gynecology

## 2019-06-08 VITALS — BP 128/75 | HR 89 | Temp 98.5°F

## 2019-06-08 DIAGNOSIS — N883 Incompetence of cervix uteri: Secondary | ICD-10-CM

## 2019-06-08 DIAGNOSIS — O24410 Gestational diabetes mellitus in pregnancy, diet controlled: Secondary | ICD-10-CM | POA: Diagnosis not present

## 2019-06-08 DIAGNOSIS — O26872 Cervical shortening, second trimester: Secondary | ICD-10-CM | POA: Diagnosis not present

## 2019-06-08 DIAGNOSIS — I10 Essential (primary) hypertension: Secondary | ICD-10-CM

## 2019-06-08 DIAGNOSIS — O99212 Obesity complicating pregnancy, second trimester: Secondary | ICD-10-CM | POA: Diagnosis not present

## 2019-06-08 DIAGNOSIS — O10012 Pre-existing essential hypertension complicating pregnancy, second trimester: Secondary | ICD-10-CM | POA: Diagnosis not present

## 2019-06-08 DIAGNOSIS — O3412 Maternal care for benign tumor of corpus uteri, second trimester: Secondary | ICD-10-CM

## 2019-06-08 DIAGNOSIS — O09512 Supervision of elderly primigravida, second trimester: Secondary | ICD-10-CM

## 2019-06-08 DIAGNOSIS — Z3A23 23 weeks gestation of pregnancy: Secondary | ICD-10-CM

## 2019-06-22 ENCOUNTER — Inpatient Hospital Stay (HOSPITAL_BASED_OUTPATIENT_CLINIC_OR_DEPARTMENT_OTHER): Payer: 59

## 2019-06-22 ENCOUNTER — Inpatient Hospital Stay (HOSPITAL_COMMUNITY)
Admission: AD | Admit: 2019-06-22 | Discharge: 2019-07-14 | DRG: 786 | Disposition: A | Discharge status: Home / Self Care | Payer: 59 | Attending: Obstetrics and Gynecology | Admitting: Obstetrics and Gynecology

## 2019-06-22 ENCOUNTER — Other Ambulatory Visit: Payer: Self-pay

## 2019-06-22 ENCOUNTER — Encounter (HOSPITAL_COMMUNITY): Payer: Self-pay | Admitting: *Deleted

## 2019-06-22 DIAGNOSIS — O26872 Cervical shortening, second trimester: Secondary | ICD-10-CM | POA: Diagnosis not present

## 2019-06-22 DIAGNOSIS — O24410 Gestational diabetes mellitus in pregnancy, diet controlled: Secondary | ICD-10-CM | POA: Diagnosis not present

## 2019-06-22 DIAGNOSIS — O3432 Maternal care for cervical incompetence, second trimester: Secondary | ICD-10-CM | POA: Diagnosis present

## 2019-06-22 DIAGNOSIS — O99212 Obesity complicating pregnancy, second trimester: Secondary | ICD-10-CM | POA: Diagnosis not present

## 2019-06-22 DIAGNOSIS — O4290 Premature rupture of membranes, unspecified as to length of time between rupture and onset of labor, unspecified weeks of gestation: Secondary | ICD-10-CM | POA: Diagnosis present

## 2019-06-22 DIAGNOSIS — O3412 Maternal care for benign tumor of corpus uteri, second trimester: Secondary | ICD-10-CM

## 2019-06-22 DIAGNOSIS — Z3A25 25 weeks gestation of pregnancy: Secondary | ICD-10-CM

## 2019-06-22 DIAGNOSIS — O10012 Pre-existing essential hypertension complicating pregnancy, second trimester: Secondary | ICD-10-CM | POA: Diagnosis present

## 2019-06-22 DIAGNOSIS — O9902 Anemia complicating childbirth: Secondary | ICD-10-CM | POA: Diagnosis present

## 2019-06-22 DIAGNOSIS — O09512 Supervision of elderly primigravida, second trimester: Secondary | ICD-10-CM

## 2019-06-22 DIAGNOSIS — O42912 Preterm premature rupture of membranes, unspecified as to length of time between rupture and onset of labor, second trimester: Secondary | ICD-10-CM | POA: Diagnosis not present

## 2019-06-22 DIAGNOSIS — O42919 Preterm premature rupture of membranes, unspecified as to length of time between rupture and onset of labor, unspecified trimester: Secondary | ICD-10-CM | POA: Diagnosis present

## 2019-06-22 DIAGNOSIS — Z20828 Contact with and (suspected) exposure to other viral communicable diseases: Secondary | ICD-10-CM | POA: Diagnosis present

## 2019-06-22 DIAGNOSIS — D259 Leiomyoma of uterus, unspecified: Secondary | ICD-10-CM | POA: Diagnosis present

## 2019-06-22 DIAGNOSIS — R52 Pain, unspecified: Secondary | ICD-10-CM

## 2019-06-22 DIAGNOSIS — O99214 Obesity complicating childbirth: Secondary | ICD-10-CM | POA: Diagnosis present

## 2019-06-22 DIAGNOSIS — D649 Anemia, unspecified: Secondary | ICD-10-CM | POA: Diagnosis present

## 2019-06-22 DIAGNOSIS — I10 Essential (primary) hypertension: Secondary | ICD-10-CM | POA: Diagnosis present

## 2019-06-22 DIAGNOSIS — O321XX0 Maternal care for breech presentation, not applicable or unspecified: Secondary | ICD-10-CM | POA: Diagnosis present

## 2019-06-22 DIAGNOSIS — Z87891 Personal history of nicotine dependence: Secondary | ICD-10-CM

## 2019-06-22 DIAGNOSIS — O24420 Gestational diabetes mellitus in childbirth, diet controlled: Secondary | ICD-10-CM | POA: Diagnosis present

## 2019-06-22 DIAGNOSIS — O1002 Pre-existing essential hypertension complicating childbirth: Secondary | ICD-10-CM | POA: Diagnosis present

## 2019-06-22 LAB — TYPE AND SCREEN
ABO/RH(D): B POS
Antibody Screen: NEGATIVE

## 2019-06-22 LAB — SARS CORONAVIRUS 2 BY RT PCR (HOSPITAL ORDER, PERFORMED IN ~~LOC~~ HOSPITAL LAB): SARS Coronavirus 2: NEGATIVE

## 2019-06-22 LAB — POCT FERN TEST: POCT Fern Test: POSITIVE

## 2019-06-22 LAB — GLUCOSE, CAPILLARY
Glucose-Capillary: 88 mg/dL (ref 70–99)
Glucose-Capillary: 154 mg/dL — ABNORMAL HIGH (ref 70–99)

## 2019-06-22 LAB — GROUP B STREP BY PCR: Group B strep by PCR: NEGATIVE

## 2019-06-22 MED ORDER — BETAMETHASONE SOD PHOS & ACET 6 (3-3) MG/ML IJ SUSP
12.0000 mg | INTRAMUSCULAR | Status: AC
  Administered 2019-06-22 – 2019-06-23 (×2): 12 mg via INTRAMUSCULAR
  Filled 2019-06-22 (×2): qty 2

## 2019-06-22 MED ORDER — HYDROXYPROGESTERONE CAPROATE 250 MG/ML IM OIL
250.0000 mg | TOPICAL_OIL | INTRAMUSCULAR | Status: DC
  Administered 2019-06-22 – 2019-07-06 (×3): 250 mg via INTRAMUSCULAR
  Filled 2019-06-22 (×4): qty 1

## 2019-06-22 MED ORDER — MAGNESIUM SULFATE BOLUS VIA INFUSION
4.0000 g | Freq: Once | INTRAVENOUS | Status: AC
  Administered 2019-06-22: 4 g via INTRAVENOUS
  Filled 2019-06-22: qty 500

## 2019-06-22 MED ORDER — ZOLPIDEM TARTRATE 5 MG PO TABS: 5.0000 mg | ORAL_TABLET | Freq: Every evening | ORAL | Status: DC | PRN

## 2019-06-22 MED ORDER — SODIUM CHLORIDE 0.9 % IV SOLN
500.0000 mg | INTRAVENOUS | Status: AC
  Administered 2019-06-22 – 2019-06-23 (×2): 500 mg via INTRAVENOUS
  Filled 2019-06-22 (×2): qty 500

## 2019-06-22 MED ORDER — DOCUSATE SODIUM 100 MG PO CAPS
100.0000 mg | ORAL_CAPSULE | Freq: Every day | ORAL | Status: DC
  Administered 2019-06-23 – 2019-07-11 (×19): 100 mg via ORAL
  Filled 2019-06-22 (×19): qty 1

## 2019-06-22 MED ORDER — LACTATED RINGERS IV SOLN
INTRAVENOUS | Status: DC
  Administered 2019-06-22 – 2019-06-24 (×5): via INTRAVENOUS

## 2019-06-22 MED ORDER — ASPIRIN 81 MG PO CHEW
81.0000 mg | CHEWABLE_TABLET | Freq: Every day | ORAL | Status: DC
  Administered 2019-06-23 – 2019-07-11 (×19): 81 mg via ORAL
  Filled 2019-06-22 (×19): qty 1

## 2019-06-22 MED ORDER — LABETALOL HCL 200 MG PO TABS
200.0000 mg | ORAL_TABLET | Freq: Two times a day (BID) | ORAL | Status: DC
  Administered 2019-06-22 – 2019-07-14 (×44): 200 mg via ORAL
  Filled 2019-06-22 (×44): qty 1

## 2019-06-22 MED ORDER — AMOXICILLIN 500 MG PO CAPS
500.0000 mg | ORAL_CAPSULE | Freq: Three times a day (TID) | ORAL | Status: AC
  Administered 2019-06-24 – 2019-06-29 (×15): 500 mg via ORAL
  Filled 2019-06-22 (×15): qty 1

## 2019-06-22 MED ORDER — PRENATAL MULTIVITAMIN CH
1.0000 | ORAL_TABLET | Freq: Every day | ORAL | Status: DC
  Administered 2019-06-22 – 2019-07-11 (×19): 1 via ORAL
  Filled 2019-06-22 (×20): qty 1

## 2019-06-22 MED ORDER — CALCIUM CARBONATE ANTACID 500 MG PO CHEW
2.0000 | CHEWABLE_TABLET | ORAL | Status: DC | PRN
  Administered 2019-06-27 – 2019-07-08 (×3): 400 mg via ORAL
  Filled 2019-06-22 (×3): qty 2

## 2019-06-22 MED ORDER — MAGNESIUM SULFATE 40 G IN LACTATED RINGERS - SIMPLE
2.0000 g/h | INTRAVENOUS | Status: AC
  Administered 2019-06-22: 2 g/h via INTRAVENOUS
  Filled 2019-06-22: qty 500

## 2019-06-22 MED ORDER — AZITHROMYCIN 250 MG PO TABS
500.0000 mg | ORAL_TABLET | Freq: Every day | ORAL | Status: AC
  Administered 2019-06-24 – 2019-06-28 (×5): 500 mg via ORAL
  Filled 2019-06-22 (×5): qty 2

## 2019-06-22 MED ORDER — ACETAMINOPHEN 325 MG PO TABS
650.0000 mg | ORAL_TABLET | ORAL | Status: DC | PRN
  Administered 2019-06-27 (×2): 650 mg via ORAL
  Filled 2019-06-22 (×4): qty 2

## 2019-06-22 MED ORDER — SODIUM CHLORIDE 0.9 % IV SOLN
2.0000 g | Freq: Four times a day (QID) | INTRAVENOUS | Status: AC
  Administered 2019-06-22 – 2019-06-24 (×8): 2 g via INTRAVENOUS
  Filled 2019-06-22 (×8): qty 2000

## 2019-06-22 NOTE — MAU Note (Signed)
.   Janet Bowen is a 38 y.o. at [redacted]w[redacted]d here in MAU reporting: leakage of fluid that started at 6 am. Pt denies pain. Cerclage placed 05/04/19.   Onset of complaint: 6am Pain score:  0 Vitals:   06/22/19 1127  BP: (!) 150/95  Pulse: (!) 105  Resp: 18  Temp: 98.2 F (36.8 C)  SpO2: 99%     FHT:145 Lab orders placed from triage: FERN

## 2019-06-22 NOTE — H&P (Signed)
Janet Bowen is a 38 y.o. female presenting @ 25 1/[redacted] weeks gestation with SROM @ 6 am clear fluid. Active fetus. Denies ctx or abdominal pain. PNC complicated by rescue cerclage 6/30, AMA, morbid obesity, Class A1 GDM, chronic HTN on med.and uterine fibroids. Last sono 8/10 showed EFW 1 lb 10 oz, cx length 1.34 cm (+) cervical incompetence with rescue cerclage  OB History    Gravida  1   Para  0   Term  0   Preterm  0   AB  0   Living  0     SAB  0   TAB  0   Ectopic  0   Multiple  0   Live Births             Past Medical History:  Diagnosis Date  . Bell's palsy   . Fibroid   . Gestational diabetes   . Hirsutism   . Hyperprolactinemia ([place])   . Hypertension   . Obesity   . PCOS (polycystic ovarian syndrome)    Dr. Simona Bowen (GYN)   Past Surgical History:  Procedure Laterality Date  . CERVICAL CERCLAGE N/A 05/04/2019   Procedure: CERCLAGE CERVICAL;  Surgeon: Janet High, MD;  Location: [facility];  Service: Gynecology;  Laterality: N/A;  . CERVICAL CONE BIOPSY  04-08-2008  . COLPOSCOPY  03-15-2008   Family History: family history includes Cerebral aneurysm in her sister; Diabetes in her maternal aunt; Hyperlipidemia in her father; Hypertension in her father; Multiple myeloma in her maternal grandmother. Social History:  reports that she quit smoking about 7 years ago. Her smoking use included cigarettes. She has a 7.50 pack-year smoking history. She has never used smokeless tobacco. She reports previous drug use. Drug: Marijuana. She reports that she does not drink alcohol.     Maternal Diabetes: Yes:  Diabetes Type:  Diet controlled Genetic Screening: Normal Maternal Ultrasounds/Referrals: Normal Fetal Ultrasounds or other Referrals:  Referred to Materal Fetal Medicine  Maternal Substance Abuse:  No Significant Maternal Medications:  Meds include: Other: labetalol Significant Maternal Lab Results:  None Other Comments:  chronic HTN, uterine fibroids, AMA,  obesity, rescue cerclage  Review of Systems  All other systems reviewed and are negative.  Maternal Medical History:  Reason for admission: Rupture of membranes.   Fetal activity: Perceived fetal activity is normal.    Prenatal complications: PIH.   Prenatal Complications - Diabetes: gestational. Diabetes is managed by diet.        Blood pressure (!) 150/95, pulse (!) 105, temperature 98.2 F (36.8 C), resp. rate 18, last menstrual period 12/28/2018, SpO2 99 %. Exam Physical Exam  Constitutional: She is oriented to person, place, and time. She appears well-developed and well-nourished.  HENT:  Head: Atraumatic.  Neck: Neck supple.  Cardiovascular: Regular rhythm.  Respiratory: Breath sounds normal.  GI: Soft.  obese  Genitourinary:    Genitourinary Comments: Gross clear AF VE deferred   Musculoskeletal:        General: No edema.  Neurological: She is alert and oriented to person, place, and time.  Skin: Skin is warm and dry.  Psychiatric: She has a normal mood and affect.    Prenatal labs: ABO, Rh: --/--/B POS, B POS (06/30 0520) Antibody: NEG (06/30 0520) Rubella:  Immune RPR:   NR HBsAg:   neg HIV:   neg GBS:   pending [place] Mfm Ob Limited  Result Date: 06/22/2019 ----------------------------------------------------------------------  OBSTETRICS REPORT                       (  Signed Final 06/22/2019 02:54 pm) ---------------------------------------------------------------------- Patient Info  ID #:       [id number]                          D.O.B.:  07-07-1981 (38 yrs)  Name:       Janet Bowen                Visit Date: 06/22/2019 11:22 am ---------------------------------------------------------------------- Performed By  Performed By:     Janet Bowen          Ref. Address:     [address]                                                             [place], [place]  Attending:        Tama High MD        [facility]  Referred By:      Janet Bowen                    Janet Demarais MD ---------------------------------------------------------------------- Orders   #  Description                          Code  Ordered By   1  [place] MFM OB LIMITED                    [phone]     Janet Bowen  ----------------------------------------------------------------------   #  Order #                    Accession #                 Episode #   1  [id number]                  [id number]                  [id number]  ---------------------------------------------------------------------- Indications   [redacted] weeks gestation of pregnancy                Z3A.25   Cervical shortening complicating pregnancy     O26.879   (cerclage 8/59/29)   Obesity complicating pregnancy, second         O99.212   trimester (Pre Pregnancy BMI 37.95)   Gestational diabetes in pregnancy, diet        O24.410   controlled   Hypertension - Chronic/Pre-existing            O10.019   (labatalol)   Advanced maternal age primigravida 84+,        O54.512   second trimester( 68 yrs)   Uterine fibroids                               O34.10   Low Risk NIPS   Premature rupture of membranes - leaking       O42.90   fluid  ---------------------------------------------------------------------- Vital Signs                                                 Height:        5'6" ---------------------------------------------------------------------- Fetal Evaluation  Num Of Fetuses:         1  Fetal Heart Rate(bpm):  129  Cardiac Activity:       Observed  Presentation:           Cephalic  Placenta:                Posterior  P. Cord Insertion:      Visualized, central  Amniotic Fluid  AFI FV:      Within normal limits                              Largest Pocket(cm)                              5.12 ---------------------------------------------------------------------- OB History  Gravidity:    1 ---------------------------------------------------------------------- Gestational Age  LMP:           25w 1d  Date:  12/28/18                 EDD:   10/04/19  Best:          25w 1d     Det. By:  LMP  (12/28/18)          EDD:   10/04/19 ---------------------------------------------------------------------- Anatomy  Thoracic:              Appears normal         Kidneys:                Appear normal  Stomach:               Appears normal, left   Bladder:                Appears normal                         sided ---------------------------------------------------------------------- Cervix Uterus Adnexa  Cervix  Not visualized (advanced GA >24wks) ---------------------------------------------------------------------- Impression  Patient is admitted with possible diagnosis of PPROM.  She had rescue cerclage on 05/04/19 and the most-recent  ultrasound performed 2 weeks ago showed the cerclage in  place (cervix 1.3 cm long).  A limited ultrasound study was performed. Amniotic fluid is  normal and good fetal activity is seen. Cephalic presentation.  On transabdominal scan, the cervix could not be assessed.  Transvaginal ultrasound was not performed.  Divided opinion exists about management of cerclage in  PPROM. I recommend to keep the cerclage till 32 weeks. If  patient develops signs of infection or goes into active labor, it  should be removed. ---------------------------------------------------------------------- Recommendations  -Weekly ultrasound evaluation if PPROM is confirmed. ----------------------------------------------------------------------                  Janet High, MD Electronically Signed Final  Report   06/22/2019 02:54 pm ----------------------------------------------------------------------  Assessment/Plan: PPROM Cervical incompetence with rescue cerclage Chronic HTN affecting preg 2nd trimester Class A1 GDM Morbid obesity IUP @ 25 1/7 weeks  uterine fibroid affecting pregnancy P) admit routine labs. NICU consult. PPROM antibiotics, cont labetalol. CBG ( FBS, 2hr PP), cont bby asa. Magnesium sulfate for CP prophylaxis. Cont fetal monitor. Cerclage remain   Skylar Flynt A Vanden Fawaz 06/22/2019, 12:03 PM

## 2019-06-22 NOTE — Progress Notes (Signed)
Dr Garwin Brothers notified of pt's arrival and SROM at approximately 6am. NO pain

## 2019-06-22 NOTE — MAU Note (Signed)
Covid swab collected. PT tolerated well. PT asymptomatic

## 2019-06-23 LAB — CBC
HCT: 31.8 % — ABNORMAL LOW (ref 36.0–46.0)
Hemoglobin: 10.7 g/dL — ABNORMAL LOW (ref 12.0–15.0)
MCH: 29.9 pg (ref 26.0–34.0)
MCHC: 33.6 g/dL (ref 30.0–36.0)
MCV: 88.8 fL (ref 80.0–100.0)
Platelets: 218 10*3/uL (ref 150–400)
RBC: 3.58 MIL/uL — ABNORMAL LOW (ref 3.87–5.11)
RDW: 12.7 % (ref 11.5–15.5)
WBC: 11.9 10*3/uL — ABNORMAL HIGH (ref 4.0–10.5)
nRBC: 0 % (ref 0.0–0.2)

## 2019-06-23 LAB — GLUCOSE, CAPILLARY
Glucose-Capillary: 118 mg/dL — ABNORMAL HIGH (ref 70–99)
Glucose-Capillary: 112 mg/dL — ABNORMAL HIGH (ref 70–99)
Glucose-Capillary: 106 mg/dL — ABNORMAL HIGH (ref 70–99)
Glucose-Capillary: 151 mg/dL — ABNORMAL HIGH (ref 70–99)

## 2019-06-23 NOTE — Progress Notes (Signed)
HD #2 25 2/7 wk PPROM Amp/Azithromycin Chronic HTN on labetalol Cervical cerclage BMZ #1 S: notes leaking less fluid. Yellow tinged fluid no odor. Denies ctx/abdominal pain  Active fetal movement  O: BP 123/68 (BP Location: Right Arm)   Pulse 88   Temp 97.7 F (36.5 C) (Oral)   Resp 18   Ht 5' 6.5" (1.689 m)   Wt 107 kg   LMP 12/28/2018   SpO2 97%   BMI 37.52 kg/m   Lungs clear to A Cor RRR Abd: gravid obese non tender Pad: scant fluid extr no edema or calf tenderness  Tracing: baseline 150-155 some variables No contractions  IMP: PPROM  Day 2 IV antibiotics. S/p magnesium sulfate IUP@ 25 2/7 Chronic HTN on labetalol Class A1 GDM   Fibroid uterus Morbid obesity P) cont inpt. CBG postprandial #s. Cont antibiotics. Monitor TID. Recheck with sono 5 days. 2nd BMZ today

## 2019-06-24 LAB — GLUCOSE, CAPILLARY
Glucose-Capillary: 122 mg/dL — ABNORMAL HIGH (ref 70–99)
Glucose-Capillary: 136 mg/dL — ABNORMAL HIGH (ref 70–99)
Glucose-Capillary: 110 mg/dL — ABNORMAL HIGH (ref 70–99)
Glucose-Capillary: 111 mg/dL — ABNORMAL HIGH (ref 70–99)
Glucose-Capillary: 103 mg/dL — ABNORMAL HIGH (ref 70–99)

## 2019-06-24 NOTE — Progress Notes (Signed)
Initial Nutrition Assessment  DOCUMENTATION CODES:  Obesity unspecified  INTERVENTION:  CHO modified gestational diabetic diet  NUTRITION DIAGNOSIS:  Increased nutrient needs related to (pregnancy and fetal growth requirements) as evidenced by (25 weeks IUP).  GOAL:  Patient will meet greater than or equal to 90% of their needs  MONITOR:  Labs  REASON FOR ASSESSMENT:  Gestational Diabetes, Antenatal    ASSESSMENT:  25 3/7 weeks adm with PROM. class A1 GDM/ diet controlled. cerclage 05/04/19  BMZ complete.  Pre-gravid wt 229 Lbs, BMI 36.5  6 lb weight gain.  Labs reviewed  Diet Order:   Diet Order            Diet gestational carb mod Room service appropriate? Yes; Fluid consistency: Thin  Diet effective now             EDUCATION NEEDS:   No education needs have been identified at this time PNR indicates referral to endocrine for GDM mgt  Skin:  Skin Assessment: Reviewed RN Assessment  Height:   Ht Readings from Last 1 Encounters:  06/23/19 5' 6.5" (1.689 m)   Weight:   Wt Readings from Last 1 Encounters:  06/24/19 106.9 kg    Ideal Body Weight:   137 lbs  BMI:  Body mass index is 37.48 kg/m.  Estimated Nutritional Needs:   Kcal:  2200-2400  Protein:  98-108 g  Fluid:  2.5 L    Weyman Rodney M.Fredderick Severance LDN Neonatal Nutrition Support Specialist/RD III Pager [phone]      Phone [phone]

## 2019-06-24 NOTE — Progress Notes (Signed)
HD #3 25 3/7 wk PPROM Amp/Azithromycin Chronic HTN on labetalol Cervical cerclage BMZ  S: active fetus. Leaking fluid  O: BP 139/81 (BP Location: Right Arm)   Pulse 88   Temp 98.5 F (36.9 C) (Oral)   Resp 18   Ht 5' 6.5" (1.689 m)   Wt 106.9 kg   LMP 12/28/2018   SpO2 99%   BMI 37.48 kg/m   Lungs clear to A Cor RRR Abd: gravid obese non tender Pad: just changed extr no edema or calf tenderness  Tracing: baseline 150-155 some variables No contractions CBG (last 3)  Recent Labs    06/24/19 1145 06/24/19 1532 06/24/19 1535  GLUCAP 136* 110* 111*   IMP: PPROM  Day 3  antibiotics. S/p magnesium sulfate IUP@ 25 3/7 Chronic HTN on labetalol Class A1 GDM   Fibroid uterus Morbid obesity P) cont inpt.  CBG testing. Await NICU consult. Repeat sono 4 days

## 2019-06-25 LAB — TYPE AND SCREEN
ABO/RH(D): B POS
Antibody Screen: NEGATIVE

## 2019-06-25 LAB — GLUCOSE, CAPILLARY
Glucose-Capillary: 74 mg/dL (ref 70–99)
Glucose-Capillary: 98 mg/dL (ref 70–99)
Glucose-Capillary: 111 mg/dL — ABNORMAL HIGH (ref 70–99)
Glucose-Capillary: 129 mg/dL — ABNORMAL HIGH (ref 70–99)

## 2019-06-25 MED ORDER — POLYETHYLENE GLYCOL 3350 17 G PO PACK
17.0000 g | PACK | ORAL | Status: DC
  Administered 2019-06-25 – 2019-07-10 (×9): 17 g via ORAL
  Filled 2019-06-25 (×10): qty 1

## 2019-06-25 MED ORDER — SODIUM CHLORIDE 0.9% FLUSH
3.0000 mL | Freq: Two times a day (BID) | INTRAVENOUS | Status: DC
  Administered 2019-06-25 – 2019-07-11 (×28): 3 mL via INTRAVENOUS

## 2019-06-25 NOTE — Progress Notes (Signed)
HD #4  25 4/7 weeks PPROM  S: some leaking of fluid but not as much  Today  active fetus BM today but request Miralax  O: BP 131/73 (BP Location: Right Arm)   Pulse 87   Temp 98 F (36.7 C) (Oral)   Resp 20   Ht 5' 6.5" (1.689 m)   Wt 106.3 kg   LMP 12/28/2018   SpO2 96%   BMI 37.24 kg/m  Lungs clear to A  Cor RRR abd gravid non tender Pad dry  exr no edema or calf tenderness  CBG (last 3)  Recent Labs    06/25/19 1204 06/25/19 1546 06/25/19 2103  GLUCAP 98 111* 129*   IMP: PPROM on antibiotics. No evidence of infection Class A1 GDM Chronic HTN on labetalol IUP @ 25 4/7 weeks Obesity  fibroid uterus P) cont present mgmt. Await NICU consult

## 2019-06-26 DIAGNOSIS — O1002 Pre-existing essential hypertension complicating childbirth: Secondary | ICD-10-CM | POA: Diagnosis present

## 2019-06-26 DIAGNOSIS — O321XX0 Maternal care for breech presentation, not applicable or unspecified: Secondary | ICD-10-CM | POA: Diagnosis present

## 2019-06-26 DIAGNOSIS — Z3A25 25 weeks gestation of pregnancy: Secondary | ICD-10-CM | POA: Diagnosis not present

## 2019-06-26 DIAGNOSIS — D259 Leiomyoma of uterus, unspecified: Secondary | ICD-10-CM | POA: Diagnosis present

## 2019-06-26 DIAGNOSIS — O24420 Gestational diabetes mellitus in childbirth, diet controlled: Secondary | ICD-10-CM | POA: Diagnosis present

## 2019-06-26 DIAGNOSIS — O99212 Obesity complicating pregnancy, second trimester: Secondary | ICD-10-CM | POA: Diagnosis not present

## 2019-06-26 DIAGNOSIS — O42912 Preterm premature rupture of membranes, unspecified as to length of time between rupture and onset of labor, second trimester: Secondary | ICD-10-CM | POA: Diagnosis present

## 2019-06-26 DIAGNOSIS — O24410 Gestational diabetes mellitus in pregnancy, diet controlled: Secondary | ICD-10-CM | POA: Diagnosis not present

## 2019-06-26 DIAGNOSIS — O3432 Maternal care for cervical incompetence, second trimester: Secondary | ICD-10-CM | POA: Diagnosis present

## 2019-06-26 DIAGNOSIS — O9902 Anemia complicating childbirth: Secondary | ICD-10-CM | POA: Diagnosis present

## 2019-06-26 DIAGNOSIS — Z20828 Contact with and (suspected) exposure to other viral communicable diseases: Secondary | ICD-10-CM | POA: Diagnosis present

## 2019-06-26 DIAGNOSIS — O3412 Maternal care for benign tumor of corpus uteri, second trimester: Secondary | ICD-10-CM | POA: Diagnosis present

## 2019-06-26 DIAGNOSIS — O26872 Cervical shortening, second trimester: Secondary | ICD-10-CM | POA: Diagnosis not present

## 2019-06-26 DIAGNOSIS — D649 Anemia, unspecified: Secondary | ICD-10-CM | POA: Diagnosis present

## 2019-06-26 DIAGNOSIS — O4292 Full-term premature rupture of membranes, unspecified as to length of time between rupture and onset of labor: Secondary | ICD-10-CM | POA: Diagnosis not present

## 2019-06-26 DIAGNOSIS — Z87891 Personal history of nicotine dependence: Secondary | ICD-10-CM | POA: Diagnosis not present

## 2019-06-26 DIAGNOSIS — O99214 Obesity complicating childbirth: Secondary | ICD-10-CM | POA: Diagnosis present

## 2019-06-26 DIAGNOSIS — O10012 Pre-existing essential hypertension complicating pregnancy, second trimester: Secondary | ICD-10-CM | POA: Diagnosis not present

## 2019-06-26 LAB — GLUCOSE, CAPILLARY
Glucose-Capillary: 82 mg/dL (ref 70–99)
Glucose-Capillary: 94 mg/dL (ref 70–99)
Glucose-Capillary: 115 mg/dL — ABNORMAL HIGH (ref 70–99)
Glucose-Capillary: 120 mg/dL — ABNORMAL HIGH (ref 70–99)

## 2019-06-26 NOTE — Consult Note (Signed)
Neonatology Consult to Antenatal Patient:  I was asked by Dr. Garwin Brothers to see this patient in order to provide antenatal counseling due to prematurity.  Janet Bowen was admitted 06/22/19 at 25 1/[redacted] weeks GA. She is currently 25 5/7 weeks and ruptured without labor.  Complicated by CHTN (on labetaolol), class A1 GDM diet controlled and cerclage 05/04/19.  BTMZ complete plus IV Mag and Amp. EFW 740g.   I spoke with the patient patient and Janet Bowen. We discussed the worst case of delivery in the next 1-2 days, including usual DR management, possible respiratory complications and need for support, IV access, feedings (mother willing to breast feed and use donor breast mlk), LOS, Mortality and Morbidity, and long term outcomes. She did have usual  questions at this time which I answered. I would be glad to come back if she has more questions later.  Thank you for asking me to see this patient.  Janet Sabal Katherina Mires, MD Neonatologist  The total length of face-to-face or floor/unit time for this encounter was 25 minutes. Counseling and/or coordination of care was 40 minutes of the above.

## 2019-06-27 DIAGNOSIS — O4290 Premature rupture of membranes, unspecified as to length of time between rupture and onset of labor, unspecified weeks of gestation: Secondary | ICD-10-CM | POA: Diagnosis present

## 2019-06-27 LAB — GLUCOSE, CAPILLARY
Glucose-Capillary: 76 mg/dL (ref 70–99)
Glucose-Capillary: 92 mg/dL (ref 70–99)
Glucose-Capillary: 144 mg/dL — ABNORMAL HIGH (ref 70–99)
Glucose-Capillary: 105 mg/dL — ABNORMAL HIGH (ref 70–99)

## 2019-06-27 NOTE — Progress Notes (Signed)
25 5/7 wk PPROM BMZ complete Chronic HTN Fibroid uterus Class A1 GDM AMA  cervical cerclage( rescue)  S:  Notes pain with fetal movement. Denies ctx. Not much leaking of fluid today  no vaginal bleeding  O: VS: BP 122/64,T98.8 P78 Lung clear to A Abd: gravid non tender Pelvic deferred Ext no edema  pad dry  8/21 CBG : 98/111  CBG: FBS 82 Tracing: baseline 145 occ variable No ctx GBS cx neg IMP: PPROM. Completing oral antibiotic course Class A1 GDM diet controlled  cervical cerclage Fibroid uterus Chronic HTN controlled on med no evidence of superimposed preeclampsia IUP @ 25 5/7 wk P) cont inpt. Repeat sono Monday or Tuesday.

## 2019-06-28 ENCOUNTER — Inpatient Hospital Stay (HOSPITAL_COMMUNITY): Payer: 59

## 2019-06-28 DIAGNOSIS — Z3A26 26 weeks gestation of pregnancy: Secondary | ICD-10-CM

## 2019-06-28 DIAGNOSIS — O24410 Gestational diabetes mellitus in pregnancy, diet controlled: Secondary | ICD-10-CM

## 2019-06-28 DIAGNOSIS — O10012 Pre-existing essential hypertension complicating pregnancy, second trimester: Secondary | ICD-10-CM

## 2019-06-28 DIAGNOSIS — O99212 Obesity complicating pregnancy, second trimester: Secondary | ICD-10-CM

## 2019-06-28 DIAGNOSIS — O42912 Preterm premature rupture of membranes, unspecified as to length of time between rupture and onset of labor, second trimester: Secondary | ICD-10-CM

## 2019-06-28 DIAGNOSIS — O09512 Supervision of elderly primigravida, second trimester: Secondary | ICD-10-CM

## 2019-06-28 DIAGNOSIS — O26872 Cervical shortening, second trimester: Secondary | ICD-10-CM

## 2019-06-28 DIAGNOSIS — O3412 Maternal care for benign tumor of corpus uteri, second trimester: Secondary | ICD-10-CM

## 2019-06-28 LAB — GLUCOSE, CAPILLARY
Glucose-Capillary: 91 mg/dL (ref 70–99)
Glucose-Capillary: 103 mg/dL — ABNORMAL HIGH (ref 70–99)
Glucose-Capillary: 124 mg/dL — ABNORMAL HIGH (ref 70–99)
Glucose-Capillary: 157 mg/dL — ABNORMAL HIGH (ref 70–99)

## 2019-06-28 LAB — CBC
HCT: 31.7 % — ABNORMAL LOW (ref 36.0–46.0)
Hemoglobin: 10.6 g/dL — ABNORMAL LOW (ref 12.0–15.0)
MCH: 29.5 pg (ref 26.0–34.0)
MCHC: 33.4 g/dL (ref 30.0–36.0)
MCV: 88.3 fL (ref 80.0–100.0)
Platelets: 266 10*3/uL (ref 150–400)
RBC: 3.59 MIL/uL — ABNORMAL LOW (ref 3.87–5.11)
RDW: 12.2 % (ref 11.5–15.5)
WBC: 7.9 10*3/uL (ref 4.0–10.5)
nRBC: 0 % (ref 0.0–0.2)

## 2019-06-28 LAB — TYPE AND SCREEN
ABO/RH(D): B POS
Antibody Screen: NEGATIVE

## 2019-06-28 MED ORDER — LACTATED RINGERS IV BOLUS
500.0000 mL | Freq: Once | INTRAVENOUS | Status: AC
  Administered 2019-06-28: 500 mL via INTRAVENOUS

## 2019-06-28 MED ORDER — INDOMETHACIN 50 MG RE SUPP
50.0000 mg | RECTAL | Status: AC
  Filled 2019-06-28: qty 1

## 2019-06-28 MED ORDER — INDOMETHACIN 50 MG RE SUPP
50.0000 mg | Freq: Once | RECTAL | Status: DC
  Administered 2019-06-28: 50 mg via RECTAL
  Filled 2019-06-28: qty 1

## 2019-06-28 MED ORDER — INDOMETHACIN 25 MG PO CAPS
25.0000 mg | ORAL_CAPSULE | Freq: Four times a day (QID) | ORAL | Status: AC
  Administered 2019-06-28 – 2019-06-30 (×8): 25 mg via ORAL
  Filled 2019-06-28 (×8): qty 1

## 2019-06-28 NOTE — Progress Notes (Signed)
25 6/7 PPROM  Chronic HTN on med  Class A1 GDM Fibroid uterus BMZ complete  S: has noted some abdominal pain that she feels extends to cerclage/cervix earlier today (+) fm No leakage of fluid  O: BP 94/46 98.1  Lungs clear to A  Cor RRR Abd: gravid non tender  Pad dry extr no edema   Tracing: baseline 150 occ variable No ctx  CBG 76/92/144  GBS cx neg  IMP: PPROM on oral antibiotics D4. BMZ complete. Magnesium sulfate Cervical cerclage ( rescue due to cervical incompetence) Class A1 GDM Chronic HTN on med Morbid obesity IUP @ 25 6/7 wk P) cont inpt. Cont CBG. Cont monitor for S/S infection

## 2019-06-28 NOTE — Progress Notes (Signed)
26 wk PPROM BMZ complete S/p magnesium for neuroprophylaxis Class A2GDM Chronic HTN AMA Fibroid uterus  S: c/o twisting pain going down to her cerclage  Pink fluid today  (+) FM  O:BP 129/69 (BP Location: Right Arm)   Pulse 88   Temp 98.1 F (36.7 C) (Oral)   Resp 18   Ht 5' 6.5" (1.689 m)   Wt 104.2 kg   LMP 12/28/2018   SpO2 99%   BMI 36.53 kg/m  Abd gravid soft non tender Pelvic: SSE" suture intact. (+) bloody fluid Ft/80/-3 Extr no edema  Tracing: baseline 155 No ctx CBG (last 3)  Recent Labs    06/28/19 0609 06/28/19 1152 06/28/19 1648  GLUCAP 91 103* 124*   IMP: PPROM [redacted] wk gestation with 2nd trim vaginal bleeding Abdominal pain in preg Cervical incompetence with cerclage and known shortened cervix Fibroid uterus Class A2 GDM Chronic HTN on labetalol On PPROM antibiotics( 2 doses left)' P) IVF bolus. Bedside sono to r/o abruption, check fluid, fetal presentation. Indocin for pain in the event related to fibroids. Bed rest. Continuous fetal monitoring

## 2019-06-28 NOTE — Progress Notes (Signed)
Dr Garwin Brothers at bedside for evaluation. See new orders.

## 2019-06-29 LAB — GLUCOSE, CAPILLARY
Glucose-Capillary: 72 mg/dL (ref 70–99)
Glucose-Capillary: 113 mg/dL — ABNORMAL HIGH (ref 70–99)
Glucose-Capillary: 116 mg/dL — ABNORMAL HIGH (ref 70–99)
Glucose-Capillary: 122 mg/dL — ABNORMAL HIGH (ref 70–99)

## 2019-06-29 NOTE — Progress Notes (Signed)
26 1/7 week  PPROM Completed antibiotics BMZ complete, magnesium sulfate Class a1 GDM Fibroid uterus on indocin Chronic HTn on med Cervical cerclage   S: c/o makena injection site pain Denies abdominal pain  reports very active fetus  denies leakage of fluid O: BP 115/63   Pulse 87   Temp 98 F (36.7 C) (Oral)   Resp 18   Ht 5' 6.5" (1.689 m)   Wt 104.3 kg   LMP 12/28/2018   SpO2 99%   BMI 36.57 kg/m   Lungs clear to A Cor RRR  abd soft gravid non tender Pad scant brown dried fluid extr no edema or calf tenderness  Tracing: baseline 150 no decel No ctx [place] Mfm Ob Limited  Result Date: 06/28/2019 ----------------------------------------------------------------------  OBSTETRICS REPORT                       (Signed Final 06/28/2019 10:46 pm) ---------------------------------------------------------------------- Patient Info  ID #:       [id number]                          D.O.B.:  [birth year] (38 yrs)  Name:       Janet Bowen                Visit Date: 06/28/2019 06:00 pm ---------------------------------------------------------------------- Performed By  Performed By:     Hubert Azure          Ref. Address:      [address]                                                              [place], [place]  Attending:        Sander Nephew      [facility]                                        [place]  Referred By:      Alanda Slim                    Jayvin Hurrell MD ---------------------------------------------------------------------- Orders   #  Description                          Code         Ordered By   1  [place] MFM OB LIMITED                     [phone]     [place]                                                        Macy Polio  ----------------------------------------------------------------------   #  Order #                    Accession #                 Episode #   1  [id number]                  [id number]  ---------------------------------------------------------------------- Indications   [redacted] weeks gestation of pregnancy                Z3A.26   Cervical shortening complicating pregnancy     O26.879   (cerclage [id number])   Obesity complicating pregnancy, second         O99.212   trimester (Pre Pregnancy BMI 37.95)   Gestational diabetes in pregnancy, diet        O24.410   controlled   Hypertension - Chronic/Pre-existing            O10.019   (labatalol)   Advanced maternal age primigravida 16+,        O56.512   second trimester( 28 yrs)   Uterine fibroids                               O34.10   Low Risk NIPS   Premature rupture of membranes - leaking       O42.90   fluid  ---------------------------------------------------------------------- Vital Signs                                                 Height:        5'6" ---------------------------------------------------------------------- Fetal Evaluation  Num Of Fetuses:          1  Fetal Heart  Rate(bpm):   138  Cardiac Activity:        Observed  Presentation:            Transverse, head to maternal right  Placenta:                Posterior  P. Cord Insertion:       Visualized  Amniotic Fluid  AFI FV:      Anhydramnios  Comment:    No placental abruption or previa identified. stomach and bladder              noted. ---------------------------------------------------------------------- OB History  Gravidity:    1 ---------------------------------------------------------------------- Gestational Age  LMP:           26w 0d        Date:  12/28/18                 EDD:   10/04/19  Best:          Janet Bowen 0d     Det. By:  LMP  (12/28/18)          EDD:   10/04/19  ---------------------------------------------------------------------- Cervix Uterus Adnexa  Cervix  Not adaquately visualized  Uterus  fundal fibroid noted.  Left Ovary  Not visualized.  Right Ovary  Not visualized.  Adnexa  No abnormality visualized. ---------------------------------------------------------------------- Impression  Anhydramnios  PPPROM  CHTN  AMA 38 yo  A1GDM ---------------------------------------------------------------------- Recommendations  Management per inpatient ----------------------------------------------------------------------               Sander Nephew, MD Electronically Signed Final Report   06/28/2019 10:46 pm ----------------------------------------------------------------------  CBC Latest Ref Rng & Units 06/28/2019 06/23/2019 05/04/2019  WBC 4.0 - 10.5 K/uL 7.9 11.9(H) 6.6  Hemoglobin 12.0 - 15.0 g/dL 10.6(L) 10.7(L) 11.5(L)  Hematocrit 36.0 - 46.0 % 31.7(L) 31.8(L) 34.5(L)  Platelets 150 - 400 K/uL 266 218 222  CBG 72/113  IMP: PPROM w/o evidence of infection. Completed antibiotics Class A1 GDM diet controlled.   chronic HTN on med  Cervical cerclage  Fibroid uterus on indocin x 48 hrs IUP @ 26 1/7 week P) cont present mgmt. Resume CHO modified diet. Change to q shift NST Reviewed labs and plan of care with pt. Repeat sono 1 wk

## 2019-06-29 NOTE — Plan of Care (Signed)
  Problem: Clinical Measurements: Goal: Complications related to the disease process, condition or treatment will be avoided or minimized Outcome: Progressing Note: Pink tinged fluid noted on assessment- no change since yesterday. Pt educated to alert nursing staff for changes in condition.  Pt utilizing bedside commode.    Problem: Clinical Measurements: Goal: Diagnostic test results will improve Outcome: Progressing   Problem: Activity: Goal: Risk for activity intolerance will decrease Outcome: Not Applicable Note: Bed rest order with bedside commode for elimination

## 2019-06-30 LAB — GLUCOSE, CAPILLARY
Glucose-Capillary: 99 mg/dL (ref 70–99)
Glucose-Capillary: 114 mg/dL — ABNORMAL HIGH (ref 70–99)
Glucose-Capillary: 105 mg/dL — ABNORMAL HIGH (ref 70–99)
Glucose-Capillary: 108 mg/dL — ABNORMAL HIGH (ref 70–99)

## 2019-06-30 NOTE — Progress Notes (Signed)
26 2/7 week  PPROM Completed antibiotics BMZ complete, magnesium sulfate Class a1 GDM Fibroid uterus on indocin Chronic HTn on med Cervical cerclage   S:  Denies abdominal pain  very active fetus  denies leakage of fluid O: BP 114/65 (BP Location: Left Arm)   Pulse 82   Temp 98 F (36.7 C) (Oral)   Resp 18   Ht 5' 6.5" (1.689 m)   Wt 105.2 kg   LMP 12/28/2018   SpO2 97%   BMI 36.89 kg/m  Lungs clear to A Cor RRR  abd soft gravid non tender Pad no fluid extr no edema or calf tenderness  Tracing: baseline 150 no decel no ctx CBG (last 3)  Recent Labs    06/29/19 1701 06/29/19 2129 06/30/19 0557  GLUCAP 116* 122* 99   IMP: PPROM w/o evidence of infection. Completed antibiotics Class A1 GDM diet controlled.   chronic HTN on med  Cervical cerclage  Fibroid uterus on indocin x 48 hrs IUP @ 26 2/7 week P) cont present mgmt

## 2019-07-01 LAB — GLUCOSE, CAPILLARY
Glucose-Capillary: 64 mg/dL — ABNORMAL LOW (ref 70–99)
Glucose-Capillary: 77 mg/dL (ref 70–99)
Glucose-Capillary: 102 mg/dL — ABNORMAL HIGH (ref 70–99)
Glucose-Capillary: 135 mg/dL — ABNORMAL HIGH (ref 70–99)
Glucose-Capillary: 99 mg/dL (ref 70–99)

## 2019-07-01 LAB — TYPE AND SCREEN
ABO/RH(D): B POS
Antibody Screen: NEGATIVE

## 2019-07-01 NOTE — Progress Notes (Signed)
Hypoglycemic Event  CBG:64 @0635   Treatment: 4oz juice  Symptoms: none  Follow-up CBG: [id number] CBG Result:77  Possible Reasons for Event: N/A  Comments/MD notified:N/A    Janet Bowen

## 2019-07-01 NOTE — Progress Notes (Signed)
Pt changed position and [place] was readjusted to recapture fetal heart rate

## 2019-07-01 NOTE — Progress Notes (Signed)
Shift assessment completed. Pt resting in bed comfortably with no complaints of pain or distress at this time. Denies h/a, vision changes and RUQ pain. Endorses +FM.

## 2019-07-01 NOTE — Progress Notes (Signed)
26 3/7 week  PPROM Completed antibiotics BMZ complete, magnesium sulfate Class a1 GDM Fibroid uterus on indocin Chronic HTn on med Cervical cerclage   S:   active fetus  denies leakage of fluid or abdominal pain  O: BP 129/74 (BP Location: Right Arm)   Pulse 93   Temp 98.2 F (36.8 C) (Oral)   Resp 18   Ht 5' 6.5" (1.689 m)   Wt 105.2 kg   LMP 12/28/2018   SpO2 98%   BMI 36.89 kg/m   Lungs clear to A Cor RRR  abd soft gravid non tender Pad no fluid extr no edema or calf tenderness  Tracing: baseline 150 small variables some accel to 160 no ctx CBG (last 3)  Recent Labs    07/01/19 0635 07/01/19 0649 07/01/19 1140  GLUCAP 64* 77 102*    IMP: PPROM w/o evidence of infection. Completed antibiotics Class A1 GDM diet controlled.   chronic HTN on med  Cervical cerclage  Fibroid uterus on indocin x 48 hrs IUP @ 26 3/7 week P) cont present mgmt. Recheck with sono on Monday

## 2019-07-02 LAB — GLUCOSE, CAPILLARY
Glucose-Capillary: 88 mg/dL (ref 70–99)
Glucose-Capillary: 101 mg/dL — ABNORMAL HIGH (ref 70–99)
Glucose-Capillary: 105 mg/dL — ABNORMAL HIGH (ref 70–99)
Glucose-Capillary: 125 mg/dL — ABNORMAL HIGH (ref 70–99)

## 2019-07-02 NOTE — Progress Notes (Signed)
26 4/7 week  PPROM Completed antibiotics BMZ complete, magnesium sulfate Class a1 GDM Fibroid uterus  Chronic HTn on med Cervical cerclage   S:   active fetus  denies  abdominal pain (+) leakage fluid clear   O: BP 127/64 (BP Location: Right Arm)   Pulse 86   Temp 98.3 F (36.8 C) (Oral)   Resp 18   Ht 5' 6.5" (1.689 m)   Wt 105.9 kg   LMP 12/28/2018   SpO2 99%   BMI 37.12 kg/m  Lungs clear to A Cor RRR  abd soft gravid non tender Pad no fluid extr no edema or calf tenderness  Tracing: baseline 150 intermittent variables. No ctx no ctx CBG (last 3)  Recent Labs    07/02/19 0621 07/02/19 1138 07/02/19 1546  GLUCAP 88 101* 105*       IMP: PPROM w/o evidence of infection. Completed antibiotics Class A1 GDM diet controlled.   chronic HTN on med  Cervical cerclage  Fibroid uterus  IUP @ 26 4/7 week P) cont present mgmt.

## 2019-07-03 ENCOUNTER — Encounter (HOSPITAL_COMMUNITY): Payer: Self-pay | Admitting: *Deleted

## 2019-07-03 LAB — GLUCOSE, CAPILLARY
Glucose-Capillary: 80 mg/dL (ref 70–99)
Glucose-Capillary: 109 mg/dL — ABNORMAL HIGH (ref 70–99)
Glucose-Capillary: 116 mg/dL — ABNORMAL HIGH (ref 70–99)
Glucose-Capillary: 115 mg/dL — ABNORMAL HIGH (ref 70–99)

## 2019-07-03 NOTE — Progress Notes (Signed)
26 5/7 week  PPROM Completed antibiotics BMZ complete, magnesium sulfate Class a1 GDM Fibroid uterus  Chronic HTn on med Cervical cerclage   S:   active fetus. (+) BM yesterday  denies  abdominal pain (+) leakage fluid clear   O: BP 124/67 (BP Location: Right Arm)   Pulse 86   Temp 97.7 F (36.5 C) (Oral)   Resp 17   Ht 5' 6.5" (1.689 m)   Wt 105.6 kg   LMP 12/28/2018   SpO2 100%   BMI 37.00 kg/m  Lungs clear to A Cor RRR  abd soft gravid non tender Pad no fluid extr no edema or calf tenderness  Tracing: baseline 150 intermittent variables. No ctx no ctx  CBG (last 3)  Recent Labs    07/02/19 1546 07/02/19 2045 07/03/19 0617  GLUCAP 105* 125* 80        IMP: PPROM w/o evidence of infection. Completed antibiotics Class A1 GDM diet controlled.   chronic HTN on med  Cervical cerclage  Fibroid uterus  IUP @ 26 5/7 week P) cont present mgmt. sono Monday

## 2019-07-04 LAB — GLUCOSE, CAPILLARY
Glucose-Capillary: 86 mg/dL (ref 70–99)
Glucose-Capillary: 99 mg/dL (ref 70–99)
Glucose-Capillary: 82 mg/dL (ref 70–99)
Glucose-Capillary: 106 mg/dL — ABNORMAL HIGH (ref 70–99)

## 2019-07-04 LAB — TYPE AND SCREEN
ABO/RH(D): B POS
Antibody Screen: NEGATIVE

## 2019-07-04 NOTE — Progress Notes (Signed)
26 6/7 week  PPROM Completed antibiotics BMZ complete, magnesium sulfate Class a1 GDM Fibroid uterus  Chronic HTn on med Cervical cerclage   S:   active fetus.   denies  abdominal pain (+) leakage fluid clear   O: BP 108/60 (BP Location: Right Arm)   Pulse 92   Temp 98 F (36.7 C) (Oral)   Resp 18   Ht 5' 6.5" (1.689 m)   Wt 103.9 kg   LMP 12/28/2018   SpO2 98%   BMI 36.41 kg/m  Lungs clear to A Cor RRR  abd soft gravid non tender Pad no fluid extr no edema or calf tenderness  Tracing: baseline 150 intermittent variables. No ctx no ctx  CBG (last 3)  Recent Labs    07/04/19 0645 07/04/19 1231 07/04/19 1633  GLUCAP 86 99 82         IMP: PPROM w/o evidence of infection. Completed antibiotics Class A1 GDM diet controlled.   chronic HTN on med  Cervical cerclage  Fibroid uterus  IUP @ 26 6/7 week P) cont present mgmt. sono in am

## 2019-07-04 NOTE — Progress Notes (Signed)
Pt. Appeared to have a decel on monitor, RN to the room for intervention. Upon entering the room pt was noted to be moving the monitor on her belly. Readjusted monitor and let pt. Know further monitoring was necessary, also that staff are watching monitors and will come in to assist with placement if needed.

## 2019-07-05 ENCOUNTER — Inpatient Hospital Stay (HOSPITAL_COMMUNITY): Payer: 59

## 2019-07-05 DIAGNOSIS — Z3A27 27 weeks gestation of pregnancy: Secondary | ICD-10-CM

## 2019-07-05 DIAGNOSIS — O26872 Cervical shortening, second trimester: Secondary | ICD-10-CM

## 2019-07-05 DIAGNOSIS — O10012 Pre-existing essential hypertension complicating pregnancy, second trimester: Secondary | ICD-10-CM

## 2019-07-05 DIAGNOSIS — O3412 Maternal care for benign tumor of corpus uteri, second trimester: Secondary | ICD-10-CM

## 2019-07-05 DIAGNOSIS — O24410 Gestational diabetes mellitus in pregnancy, diet controlled: Secondary | ICD-10-CM

## 2019-07-05 DIAGNOSIS — O99212 Obesity complicating pregnancy, second trimester: Secondary | ICD-10-CM

## 2019-07-05 DIAGNOSIS — O3442 Maternal care for other abnormalities of cervix, second trimester: Secondary | ICD-10-CM

## 2019-07-05 DIAGNOSIS — O42912 Preterm premature rupture of membranes, unspecified as to length of time between rupture and onset of labor, second trimester: Secondary | ICD-10-CM

## 2019-07-05 DIAGNOSIS — O09512 Supervision of elderly primigravida, second trimester: Secondary | ICD-10-CM

## 2019-07-05 LAB — GLUCOSE, CAPILLARY
Glucose-Capillary: 90 mg/dL (ref 70–99)
Glucose-Capillary: 120 mg/dL — ABNORMAL HIGH (ref 70–99)
Glucose-Capillary: 96 mg/dL (ref 70–99)
Glucose-Capillary: 91 mg/dL (ref 70–99)

## 2019-07-05 NOTE — Progress Notes (Signed)
27 week  PPROM Completed antibiotics BMZ complete, magnesium sulfate Class a1 GDM Fibroid uterus  Chronic HTn on med Cervical cerclage   S:  (+) leakage fluid clear ( small) Active fetus denies abdominal pain  O: BP 123/86 (BP Location: Right Arm)   Pulse 98   Temp 98.1 F (36.7 C) (Oral)   Resp 18   Ht 5' 6.5" (1.689 m)   Wt 104.8 kg   LMP 12/28/2018   SpO2 100%   BMI 36.73 kg/m   Lungs clear to A Cor RRR  abd soft gravid non tender Pad no fluid extr no edema or calf tenderness  Tracing:  Baseline 150 (+) small accel to 160 no decel no ctx no ctx  CBG (last 3)  Recent Labs    07/05/19 0646 07/05/19 1220 07/05/19 1636  GLUCAP 90 120* 96   [place] Mfm Ob Detail +14 Wk  Result Date: 07/05/2019 ----------------------------------------------------------------------  OBSTETRICS REPORT                       (Signed Final 07/05/2019 12:04 pm) ---------------------------------------------------------------------- Patient Info  ID #:       [id number]                          D.O.B.:  [birth year] (38 yrs)  Name:       Janet Bowen                Visit Date: 07/05/2019 07:36 am ---------------------------------------------------------------------- Performed By  Performed By:     Corky Crafts             Ref. Address:     [place], [place]  [place]  Attending:        Tama High MD        [facility]  Referred By:      Alanda Slim                    Tayven Renteria MD ----------------------------------------------------------------------  Orders   #  Description                          Code         Ordered By   1  [place] MFM OB DETAIL +14 [place]              76811.01     Henryk Ursin  ----------------------------------------------------------------------   #  Order #                    Accession #                 Episode #   1  [id number]                  [id number]                  [id number]  ---------------------------------------------------------------------- Indications   Encounter for antenatal screening for          Z36.3   malformations (low risk NIPS)   Premature rupture of membranes - leaking       O42.90   fluid (positive Maryann Alar 06/22/19; Anhydramnios)   [redacted] weeks gestation of pregnancy                Z3A.27   Cervical shortening complicating pregnancy     O26.879   (cerclage [id number])   Obesity complicating pregnancy, second         O99.212   trimester (Pre Pregnancy BMI 37.95)   Gestational diabetes in pregnancy, diet        O24.410   controlled   Hypertension - Chronic/Pre-existing            O10.019   (labatalol)   Advanced maternal age primigravida 9+,        O35.512   second trimester( 38 yrs)   Uterine fibroids                               O34.10   Previous cervical surgery (cervical cone       O34.40   biopsy)  ---------------------------------------------------------------------- Vital Signs  Weight (lb): 231                               Height:  5'6"  BMI:         37.28 ---------------------------------------------------------------------- Fetal Evaluation  Num Of Fetuses:         1  Fetal Heart Rate(bpm):  150  Cardiac Activity:       Observed  Presentation:           Breech  Placenta:               Posterior  P. Cord Insertion:      Not well visualized  Amniotic Fluid  AFI FV:      Anhydramnios ---------------------------------------------------------------------- Biometry  BPD:      63.8  mm     G. Age:  25w 6d          9  %    CI:        73.04   %    70 - 86                                                           FL/HC:      21.7   %    18.6 - 20.4  HC:      237.3  mm     G. Age:  25w 5d          3  %    HC/AC:      1.02        1.05 - 1.21  AC:      233.3  mm     G. Age:  27w 5d         63  %    FL/BPD:     80.7   %    71 - 87  FL:       51.5  mm     G. Age:  27w 4d         51  %    FL/AC:      22.1   %    20 - 24  HUM:      46.3  mm     G. Age:  27w 2d         53  %  CER:        33  mm     G. Age:  29w 0d         80  %  LV:        4.5  mm  CM:        5.1  mm  Est. FW:    1056  gm      2 lb 5 oz     50  % ---------------------------------------------------------------------- OB History  Gravidity:    1 ---------------------------------------------------------------------- Gestational Age  LMP:           27w 0d        Date:  12/28/18                 EDD:   10/04/19  U/S Today:     26w 5d                                        EDD:   10/06/19  Best:  27w 0d     Det. By:  LMP  (12/28/18)          EDD:   10/04/19 ---------------------------------------------------------------------- Anatomy  Cranium:               Appears normal         LVOT:                   Not well visualized  Cavum:                 Appears normal         Aortic Arch:            Appears normal  Ventricles:            Appears normal         Ductal Arch:            Not well visualized  Choroid Plexus:        Appears normal         Diaphragm:              Not well visualized  Cerebellum:            Appears normal         Stomach:                Appears normal, left                                                                        sided  Posterior Fossa:       Appears normal         Abdomen:                Not well visualized  Nuchal Fold:           Not applicable ([id number]    Abdominal Wall:         Not well visualized                         wks GA)  Face:                  Not well visualized    Cord Vessels:           Appears normal (3                                                                         vessel cord)  Lips:                  Not well visualized    Kidneys:                Appear normal  Palate:                Not well visualized    Bladder:  Appears normal  Thoracic:              Not well visualized    Spine:                  Not well visualized  Heart:                 Not well visualized    Upper Extremities:      Not well visualized  RVOT:                  Not well visualized    Lower Extremities:      Not well visualized  Other:  Technically difficult due to fetal position and no fluid. ---------------------------------------------------------------------- Cervix Uterus Adnexa  Cervix  Not visualized (advanced GA >24wks) ---------------------------------------------------------------------- Impression  Patient is admitted with diagnosis of PPROM.  She had rescue cerclage in this pregnancy.  On ultrasound, anhydramnios is seen. BREECH  presentation. Fetal growth is appropriate for gestational age.  Fetal anatomy is very limited because of anhydramnios, but  no obvious structural defects are seen.  I spoke to her on phone and reassured her of normal fetal  growth. ---------------------------------------------------------------------- Recommendations  -Weekly limited ultrasound.  -BPP from 30 weeks.  -Continue expectant management.  -Cerclage to be removed at 32 weeks.  -Consider prophylactic anticoagulation (heparin) if ambulation  is restricted. ----------------------------------------------------------------------                  Tama High, MD Electronically Signed Final Report   07/05/2019 12:04 pm ----------------------------------------------------------------------  IMP: PPROM w/o evidence of infection.s/p antibiotics Class A1 GDM diet controlled.   chronic HTN on med  Cervical cerclage  Fibroid uterus  IUP @ 27 week Breech P) cont present mgmt.

## 2019-07-06 LAB — GLUCOSE, CAPILLARY
Glucose-Capillary: 98 mg/dL (ref 70–99)
Glucose-Capillary: 96 mg/dL (ref 70–99)
Glucose-Capillary: 82 mg/dL (ref 70–99)
Glucose-Capillary: 123 mg/dL — ABNORMAL HIGH (ref 70–99)

## 2019-07-06 NOTE — Progress Notes (Signed)
27 1/7 week  PPROM Completed antibiotics BMZ complete, magnesium sulfate Class a1 GDM Fibroid uterus  Chronic HTn on med Cervical cerclage   S:  (+) leakage fluid clear  Active fetus  Denies abd pain O: BP 117/66   Pulse 98   Temp 98 F (36.7 C) (Oral)   Resp 18   Ht 5' 6.5" (1.689 m)   Wt 105.1 kg   LMP 12/28/2018   SpO2 100%   BMI 36.85 kg/m    Lungs clear to A Cor RRR  abd soft gravid non tender Pad no fluid extr no edema or calf tenderness  Tracing: baseline 145 small accel. Occ variable no ctx  CBG (last 3)  Recent Labs    07/05/19 2213 07/06/19 0629 07/06/19 1219  GLUCAP 91 98 96   IMP: PPROM w/o evidence of infection.s/p antibiotics Class A1 GDM diet controlled.   chronic HTN on med  Cervical cerclage  Fibroid uterus  IUP @ 27 1/7  week Breech P) cont present mgmt. Repeat sono monday

## 2019-07-07 LAB — TYPE AND SCREEN
ABO/RH(D): B POS
Antibody Screen: NEGATIVE

## 2019-07-07 LAB — GLUCOSE, CAPILLARY
Glucose-Capillary: 77 mg/dL (ref 70–99)
Glucose-Capillary: 97 mg/dL (ref 70–99)
Glucose-Capillary: 105 mg/dL — ABNORMAL HIGH (ref 70–99)
Glucose-Capillary: 92 mg/dL (ref 70–99)

## 2019-07-07 NOTE — Progress Notes (Signed)
27 2/7 week  PPROM Breech BMZ complete, magnesium sulfate Class A1 GDM Fibroid uterus  Chronic HTn on med Cervical cerclage   S:  (+) leakage fluid clear  O: BP (!) 114/59 (BP Location: Right Arm)   Pulse 91   Temp 98.5 F (36.9 C) (Oral)   Resp 18   Ht 5' 6.5" (1.689 m)   Wt 104.9 kg   LMP 12/28/2018   SpO2 100%   BMI 36.77 kg/m    Lungs clear to A Cor RRR  abd soft gravid non tender Pad no fluid extr no edema or calf tenderness  Tracing:  Baseline 150 (+) variability. Some variable  no ctx  CBG (last 3)  Recent Labs    07/07/19 0619 07/07/19 1216 07/07/19 1814  GLUCAP 77 97 105*    IMP: PPROM w/o evidence of infection.s/p antibiotics Class A1 GDM diet controlled.   chronic HTN on med  Cervical cerclage  Fibroid uterus  IUP @ 27 2/7  week Breech P) cont present mgmt. Repeat sono monday

## 2019-07-08 LAB — GLUCOSE, CAPILLARY
Glucose-Capillary: 102 mg/dL — ABNORMAL HIGH (ref 70–99)
Glucose-Capillary: 115 mg/dL — ABNORMAL HIGH (ref 70–99)
Glucose-Capillary: 110 mg/dL — ABNORMAL HIGH (ref 70–99)
Glucose-Capillary: 108 mg/dL — ABNORMAL HIGH (ref 70–99)

## 2019-07-08 NOTE — Progress Notes (Signed)
27 3/7 wk PPROM  Breech Chronic HTN controlled on med Fibroid uterus Class A1 GDM diet controlled BMZ complete. Magnesium  GBS cx neg  S: notes leaking fluid. (+) feels fetal movements Denies ctx  O: BP 112/69 (BP Location: Right Arm)   Pulse 97   Temp 97.9 F (36.6 C) (Oral)   Resp 17   Ht 5' 6.5" (1.689 m)   Wt 104.2 kg   LMP 12/28/2018   SpO2 96%   BMI 36.53 kg/m  Lungs clear to A Cor RRR Abd: gravid non tender Pelvic deferred extr no edema  CBG (last 3)  Recent Labs    07/07/19 2212 07/08/19 0704 07/08/19 1149  GLUCAP 92 102* 115*   tracing: baseline 150 (+) variability No ctx  IMP: PPROM  Now 27 3/7 weeks s/p BMZ, magnesium Class A1 GDM diet controlled  chronic HTN controlled on med. No clinical evidence of superimposed preeclampsia BREECH Fibroid uterus P) remain in pt

## 2019-07-09 LAB — GLUCOSE, CAPILLARY
Glucose-Capillary: 85 mg/dL (ref 70–99)
Glucose-Capillary: 103 mg/dL — ABNORMAL HIGH (ref 70–99)
Glucose-Capillary: 126 mg/dL — ABNORMAL HIGH (ref 70–99)
Glucose-Capillary: 63 mg/dL — ABNORMAL LOW (ref 70–99)
Glucose-Capillary: 107 mg/dL — ABNORMAL HIGH (ref 70–99)

## 2019-07-09 NOTE — Progress Notes (Signed)
27 4/7 wk PPROM  Breech Chronic HTN controlled on med Fibroid uterus Class A1 GDM diet controlled BMZ complete. Magnesium  GBS cx neg  S: notes leaking fluid. (+) feels fetal movements Denies ctx  O: BP 123/68 (BP Location: Right Arm)   Pulse 98   Temp 98.2 F (36.8 C) (Oral)   Resp 18   Ht 5' 6.5" (1.689 m)   Wt 104.3 kg   LMP 12/28/2018   SpO2 100%   BMI 36.57 kg/m   Lungs clear to A  Cor RRR  Abd gravid non tender Pad clear fluid no odor Extr no edema or calf tenderness  Tracing; Baseline 150-155 (+) occ variable No ctx BS log reviewed: 85/103/126  IMP: PPROM Breech presentation Fibroid uterus  Class A1 GDM  Chronic HTN on med  IUP @ 27 4/7 wk  P) cont input

## 2019-07-10 LAB — GLUCOSE, CAPILLARY
Glucose-Capillary: 84 mg/dL (ref 70–99)
Glucose-Capillary: 112 mg/dL — ABNORMAL HIGH (ref 70–99)
Glucose-Capillary: 103 mg/dL — ABNORMAL HIGH (ref 70–99)
Glucose-Capillary: 120 mg/dL — ABNORMAL HIGH (ref 70–99)

## 2019-07-10 LAB — TYPE AND SCREEN
ABO/RH(D): B POS
Antibody Screen: NEGATIVE

## 2019-07-10 NOTE — Progress Notes (Signed)
  27 5/7 wk PPROM Breech  Class A1 GDM Chronic HTN  controlled  S: leaking fluid (+) BM  denies ctx or abdominal pain   O: BP 107/66 (BP Location: Left Arm)   Pulse 93   Temp 97.9 F (36.6 C) (Oral)   Resp 18   Ht 5' 6.5" (1.689 m)   Wt 104.3 kg   LMP 12/28/2018   SpO2 99%   BMI 36.57 kg/m   Lungs clear to A Cor RRR  Abd gravid nontender Pad dry  ext no edema  Tracing: baseline 150 (+) accels some variables  no ctx  CBG (last 3)  Recent Labs    07/09/19 1757 07/09/19 2129 07/10/19 0645  GLUCAP 126* 107* 84  IMP:  PPROM breech 27 5/7 wks Diet controlled Class A1 GDM Chronic HTN w/o superimposed preeclampsia P) cont inpt mgmt. NST 3x/d

## 2019-07-11 ENCOUNTER — Inpatient Hospital Stay (HOSPITAL_COMMUNITY): Payer: 59 | Admitting: Anesthesiology

## 2019-07-11 ENCOUNTER — Inpatient Hospital Stay (HOSPITAL_COMMUNITY): Payer: 59

## 2019-07-11 ENCOUNTER — Encounter (HOSPITAL_COMMUNITY)
Admission: AD | Disposition: A | Discharge status: Home / Self Care | Payer: Self-pay | Source: Home / Self Care | Attending: Obstetrics and Gynecology

## 2019-07-11 DIAGNOSIS — O99212 Obesity complicating pregnancy, second trimester: Secondary | ICD-10-CM

## 2019-07-11 DIAGNOSIS — Z3A27 27 weeks gestation of pregnancy: Secondary | ICD-10-CM

## 2019-07-11 DIAGNOSIS — O3412 Maternal care for benign tumor of corpus uteri, second trimester: Secondary | ICD-10-CM

## 2019-07-11 DIAGNOSIS — O09512 Supervision of elderly primigravida, second trimester: Secondary | ICD-10-CM

## 2019-07-11 DIAGNOSIS — O3442 Maternal care for other abnormalities of cervix, second trimester: Secondary | ICD-10-CM

## 2019-07-11 DIAGNOSIS — O26872 Cervical shortening, second trimester: Secondary | ICD-10-CM

## 2019-07-11 DIAGNOSIS — O24410 Gestational diabetes mellitus in pregnancy, diet controlled: Secondary | ICD-10-CM

## 2019-07-11 DIAGNOSIS — O4292 Full-term premature rupture of membranes, unspecified as to length of time between rupture and onset of labor: Secondary | ICD-10-CM

## 2019-07-11 DIAGNOSIS — O10012 Pre-existing essential hypertension complicating pregnancy, second trimester: Secondary | ICD-10-CM

## 2019-07-11 LAB — CBC
HCT: 33 % — ABNORMAL LOW (ref 36.0–46.0)
Hemoglobin: 11.4 g/dL — ABNORMAL LOW (ref 12.0–15.0)
MCH: 29.8 pg (ref 26.0–34.0)
MCHC: 34.5 g/dL (ref 30.0–36.0)
MCV: 86.2 fL (ref 80.0–100.0)
Platelets: 229 10*3/uL (ref 150–400)
RBC: 3.83 MIL/uL — ABNORMAL LOW (ref 3.87–5.11)
RDW: 12.1 % (ref 11.5–15.5)
WBC: 7.7 10*3/uL (ref 4.0–10.5)
nRBC: 0 % (ref 0.0–0.2)

## 2019-07-11 LAB — GLUCOSE, CAPILLARY
Glucose-Capillary: 90 mg/dL (ref 70–99)
Glucose-Capillary: 90 mg/dL (ref 70–99)

## 2019-07-11 SURGERY — Surgical Case
Anesthesia: Spinal

## 2019-07-11 MED ORDER — NALBUPHINE HCL 10 MG/ML IJ SOLN
5.0000 mg | Freq: Once | INTRAMUSCULAR | Status: DC | PRN
  Filled 2019-07-11: qty 0.5

## 2019-07-11 MED ORDER — NALBUPHINE HCL 10 MG/ML IJ SOLN
5.0000 mg | INTRAMUSCULAR | Status: DC | PRN
  Filled 2019-07-11: qty 0.5

## 2019-07-11 MED ORDER — PHENYLEPHRINE 40 MCG/ML (10ML) SYRINGE FOR IV PUSH (FOR BLOOD PRESSURE SUPPORT)
PREFILLED_SYRINGE | INTRAVENOUS | Status: AC
  Filled 2019-07-11: qty 10

## 2019-07-11 MED ORDER — BUPIVACAINE HCL (PF) 0.25 % IJ SOLN
INTRAMUSCULAR | Status: DC | PRN
  Administered 2019-07-11: 10 mL

## 2019-07-11 MED ORDER — BUPIVACAINE IN DEXTROSE 0.75-8.25 % IT SOLN
INTRATHECAL | Status: DC | PRN
  Administered 2019-07-11: 1.6 mL via INTRATHECAL

## 2019-07-11 MED ORDER — WITCH HAZEL-GLYCERIN EX PADS: 1.0000 "application " | MEDICATED_PAD | CUTANEOUS | Status: DC | PRN

## 2019-07-11 MED ORDER — FENTANYL CITRATE (PF) 100 MCG/2ML IJ SOLN
INTRAMUSCULAR | Status: DC | PRN
  Administered 2019-07-11: 35 ug via INTRAVENOUS
  Administered 2019-07-11: 15 ug via INTRATHECAL
  Administered 2019-07-11 (×3): 50 ug via INTRAVENOUS

## 2019-07-11 MED ORDER — DIBUCAINE (PERIANAL) 1 % EX OINT: 1.0000 "application " | TOPICAL_OINTMENT | CUTANEOUS | Status: DC | PRN

## 2019-07-11 MED ORDER — LACTATED RINGERS IV SOLN
INTRAVENOUS | Status: DC
  Administered 2019-07-12: 10:00:00 via INTRAVENOUS

## 2019-07-11 MED ORDER — SIMETHICONE 80 MG PO CHEW
80.0000 mg | CHEWABLE_TABLET | Freq: Three times a day (TID) | ORAL | Status: DC
  Administered 2019-07-12 – 2019-07-14 (×5): 80 mg via ORAL
  Filled 2019-07-11 (×5): qty 1

## 2019-07-11 MED ORDER — SIMETHICONE 80 MG PO CHEW
80.0000 mg | CHEWABLE_TABLET | ORAL | Status: DC
  Administered 2019-07-11 – 2019-07-14 (×3): 80 mg via ORAL
  Filled 2019-07-11 (×3): qty 1

## 2019-07-11 MED ORDER — IBUPROFEN 800 MG PO TABS
800.0000 mg | ORAL_TABLET | Freq: Three times a day (TID) | ORAL | Status: DC
  Administered 2019-07-12 – 2019-07-14 (×6): 800 mg via ORAL
  Filled 2019-07-11 (×6): qty 1

## 2019-07-11 MED ORDER — SCOPOLAMINE 1 MG/3DAYS TD PT72
MEDICATED_PATCH | TRANSDERMAL | Status: DC | PRN
  Administered 2019-07-11: 1 via TRANSDERMAL

## 2019-07-11 MED ORDER — NALOXONE HCL 0.4 MG/ML IJ SOLN: 0.4000 mg | INTRAMUSCULAR | Status: DC | PRN

## 2019-07-11 MED ORDER — SODIUM CHLORIDE 0.9 % IR SOLN
Status: DC | PRN
  Administered 2019-07-11: 1

## 2019-07-11 MED ORDER — PHENYLEPHRINE HCL-NACL 20-0.9 MG/250ML-% IV SOLN
INTRAVENOUS | Status: DC | PRN
  Administered 2019-07-11: 60 ug/min via INTRAVENOUS

## 2019-07-11 MED ORDER — OXYTOCIN 40 UNITS IN NORMAL SALINE INFUSION - SIMPLE MED
2.5000 [IU]/h | INTRAVENOUS | Status: AC
  Administered 2019-07-11: 2.5 [IU]/h via INTRAVENOUS
  Filled 2019-07-11: qty 1000

## 2019-07-11 MED ORDER — SCOPOLAMINE 1 MG/3DAYS TD PT72
MEDICATED_PATCH | TRANSDERMAL | Status: AC
  Filled 2019-07-11: qty 1

## 2019-07-11 MED ORDER — SODIUM CHLORIDE 0.9 % IV SOLN
INTRAVENOUS | Status: DC | PRN
  Administered 2019-07-11: 17:00:00 via INTRAVENOUS

## 2019-07-11 MED ORDER — PHENYLEPHRINE HCL-NACL 20-0.9 MG/250ML-% IV SOLN
INTRAVENOUS | Status: AC
  Filled 2019-07-11: qty 250

## 2019-07-11 MED ORDER — CHLOROPROCAINE HCL (PF) 3 % IJ SOLN
INTRAMUSCULAR | Status: AC
  Filled 2019-07-11: qty 20

## 2019-07-11 MED ORDER — KETOROLAC TROMETHAMINE 30 MG/ML IJ SOLN
30.0000 mg | Freq: Four times a day (QID) | INTRAMUSCULAR | Status: AC | PRN
  Administered 2019-07-11: 30 mg via INTRAMUSCULAR

## 2019-07-11 MED ORDER — KETOROLAC TROMETHAMINE 30 MG/ML IJ SOLN
30.0000 mg | Freq: Four times a day (QID) | INTRAMUSCULAR | Status: AC | PRN
  Administered 2019-07-12: 30 mg via INTRAVENOUS
  Filled 2019-07-11: qty 1

## 2019-07-11 MED ORDER — FENTANYL CITRATE (PF) 100 MCG/2ML IJ SOLN
25.0000 ug | INTRAMUSCULAR | Status: DC | PRN
  Administered 2019-07-11: 25 ug via INTRAVENOUS

## 2019-07-11 MED ORDER — LACTATED RINGERS IV BOLUS: 500.0000 mL | Freq: Once | INTRAVENOUS | Status: DC

## 2019-07-11 MED ORDER — PROMETHAZINE HCL 25 MG/ML IJ SOLN: 6.2500 mg | INTRAMUSCULAR | Status: DC | PRN

## 2019-07-11 MED ORDER — OXYTOCIN 10 UNIT/ML IJ SOLN
INTRAMUSCULAR | Status: DC | PRN
  Administered 2019-07-11: 40 [IU]

## 2019-07-11 MED ORDER — DIPHENHYDRAMINE HCL 25 MG PO CAPS: 25.0000 mg | ORAL_CAPSULE | Freq: Four times a day (QID) | ORAL | Status: DC | PRN

## 2019-07-11 MED ORDER — FENTANYL CITRATE (PF) 100 MCG/2ML IJ SOLN
INTRAMUSCULAR | Status: AC
  Filled 2019-07-11: qty 2

## 2019-07-11 MED ORDER — DEXMEDETOMIDINE HCL IN NACL 200 MCG/50ML IV SOLN
INTRAVENOUS | Status: AC
  Filled 2019-07-11: qty 50

## 2019-07-11 MED ORDER — FENTANYL CITRATE (PF) 100 MCG/2ML IJ SOLN: INTRAMUSCULAR | Status: DC | PRN

## 2019-07-11 MED ORDER — KETOROLAC TROMETHAMINE 30 MG/ML IJ SOLN: 30.0000 mg | Freq: Once | INTRAMUSCULAR | Status: DC

## 2019-07-11 MED ORDER — ACETAMINOPHEN 500 MG PO TABS
1000.0000 mg | ORAL_TABLET | Freq: Four times a day (QID) | ORAL | Status: AC
  Filled 2019-07-11 (×2): qty 2

## 2019-07-11 MED ORDER — ACETAMINOPHEN 160 MG/5ML PO SOLN: 1000.0000 mg | Freq: Once | ORAL | Status: DC

## 2019-07-11 MED ORDER — SIMETHICONE 80 MG PO CHEW: 80.0000 mg | CHEWABLE_TABLET | ORAL | Status: DC | PRN

## 2019-07-11 MED ORDER — CEFAZOLIN SODIUM-DEXTROSE 2-3 GM-%(50ML) IV SOLR
INTRAVENOUS | Status: DC | PRN
  Administered 2019-07-11: 2 g via INTRAVENOUS

## 2019-07-11 MED ORDER — PRENATAL MULTIVITAMIN CH
1.0000 | ORAL_TABLET | Freq: Every day | ORAL | Status: DC
  Administered 2019-07-12 – 2019-07-14 (×3): 1 via ORAL
  Filled 2019-07-11 (×3): qty 1

## 2019-07-11 MED ORDER — CHLOROPROCAINE HCL (PF) 3 % IJ SOLN
INTRAMUSCULAR | Status: DC | PRN
  Administered 2019-07-11: 20 mL

## 2019-07-11 MED ORDER — ONDANSETRON HCL 4 MG/2ML IJ SOLN: 4.0000 mg | Freq: Three times a day (TID) | INTRAMUSCULAR | Status: DC | PRN

## 2019-07-11 MED ORDER — MENTHOL 3 MG MT LOZG: 1.0000 | LOZENGE | OROMUCOSAL | Status: DC | PRN

## 2019-07-11 MED ORDER — ONDANSETRON HCL 4 MG/2ML IJ SOLN
INTRAMUSCULAR | Status: AC
  Filled 2019-07-11: qty 2

## 2019-07-11 MED ORDER — DEXMEDETOMIDINE HCL 200 MCG/2ML IV SOLN
INTRAVENOUS | Status: DC | PRN
  Administered 2019-07-11 (×5): 8 ug via INTRAVENOUS

## 2019-07-11 MED ORDER — BUPIVACAINE HCL (PF) 0.25 % IJ SOLN
INTRAMUSCULAR | Status: AC
  Filled 2019-07-11: qty 10

## 2019-07-11 MED ORDER — LACTATED RINGERS IV SOLN
INTRAVENOUS | Status: DC | PRN
  Administered 2019-07-11 (×2): via INTRAVENOUS

## 2019-07-11 MED ORDER — DEXAMETHASONE SODIUM PHOSPHATE 4 MG/ML IJ SOLN
INTRAMUSCULAR | Status: AC
  Filled 2019-07-11: qty 1

## 2019-07-11 MED ORDER — DEXAMETHASONE SODIUM PHOSPHATE 4 MG/ML IJ SOLN
INTRAMUSCULAR | Status: DC | PRN
  Administered 2019-07-11: 4 mg via INTRAVENOUS

## 2019-07-11 MED ORDER — COCONUT OIL OIL: 1.0000 "application " | TOPICAL_OIL | Status: DC | PRN

## 2019-07-11 MED ORDER — MORPHINE SULFATE (PF) 0.5 MG/ML IJ SOLN
INTRAMUSCULAR | Status: AC
  Filled 2019-07-11: qty 10

## 2019-07-11 MED ORDER — NALOXONE HCL 4 MG/10ML IJ SOLN
1.0000 ug/kg/h | INTRAVENOUS | Status: DC | PRN
  Filled 2019-07-11: qty 5

## 2019-07-11 MED ORDER — SODIUM CHLORIDE 0.9% FLUSH: 3.0000 mL | INTRAVENOUS | Status: DC | PRN

## 2019-07-11 MED ORDER — ZOLPIDEM TARTRATE 5 MG PO TABS: 5.0000 mg | ORAL_TABLET | Freq: Every evening | ORAL | Status: DC | PRN

## 2019-07-11 MED ORDER — PROPOFOL 10 MG/ML IV BOLUS
INTRAVENOUS | Status: AC
  Filled 2019-07-11: qty 20

## 2019-07-11 MED ORDER — OXYTOCIN 40 UNITS IN NORMAL SALINE INFUSION - SIMPLE MED
INTRAVENOUS | Status: AC
  Filled 2019-07-11: qty 1000

## 2019-07-11 MED ORDER — ONDANSETRON HCL 4 MG/2ML IJ SOLN
INTRAMUSCULAR | Status: DC | PRN
  Administered 2019-07-11: 4 mg via INTRAVENOUS

## 2019-07-11 MED ORDER — ACETAMINOPHEN 500 MG PO TABS
1000.0000 mg | ORAL_TABLET | Freq: Four times a day (QID) | ORAL | Status: DC
  Administered 2019-07-11 – 2019-07-14 (×10): 1000 mg via ORAL
  Filled 2019-07-11 (×9): qty 2

## 2019-07-11 MED ORDER — MORPHINE SULFATE (PF) 10 MG/ML IV SOLN
INTRAVENOUS | Status: DC | PRN
  Administered 2019-07-11: .15 mg via INTRATHECAL

## 2019-07-11 MED ORDER — DIPHENHYDRAMINE HCL 50 MG/ML IJ SOLN: 12.5000 mg | INTRAMUSCULAR | Status: DC | PRN

## 2019-07-11 MED ORDER — DIPHENHYDRAMINE HCL 25 MG PO CAPS: 25.0000 mg | ORAL_CAPSULE | ORAL | Status: DC | PRN

## 2019-07-11 MED ORDER — ACETAMINOPHEN 500 MG PO TABS: 1000.0000 mg | ORAL_TABLET | Freq: Once | ORAL | Status: DC

## 2019-07-11 MED ORDER — SOD CITRATE-CITRIC ACID 500-334 MG/5ML PO SOLN
ORAL | Status: AC
  Filled 2019-07-11: qty 30

## 2019-07-11 MED ORDER — KETOROLAC TROMETHAMINE 30 MG/ML IJ SOLN
INTRAMUSCULAR | Status: AC
  Filled 2019-07-11: qty 1

## 2019-07-11 MED ORDER — OXYCODONE HCL 5 MG PO TABS: 5.0000 mg | ORAL_TABLET | ORAL | Status: DC | PRN

## 2019-07-11 MED ORDER — PHENYLEPHRINE HCL (PRESSORS) 10 MG/ML IV SOLN
INTRAVENOUS | Status: DC | PRN
  Administered 2019-07-11: 80 mg via INTRAVENOUS

## 2019-07-11 MED ORDER — SENNOSIDES-DOCUSATE SODIUM 8.6-50 MG PO TABS
2.0000 | ORAL_TABLET | ORAL | Status: DC
  Administered 2019-07-11 – 2019-07-14 (×3): 2 via ORAL
  Filled 2019-07-11 (×3): qty 2

## 2019-07-11 SURGICAL SUPPLY — 48 items
APL SKNCLS STERI-STRIP NONHPOA (GAUZE/BANDAGES/DRESSINGS) ×1
BARRIER ADHS 3X4 INTERCEED (GAUZE/BANDAGES/DRESSINGS) ×3 IMPLANT
BENZOIN TINCTURE PRP APPL 2/3 (GAUZE/BANDAGES/DRESSINGS) ×1 IMPLANT
BRR ADH 4X3 ABS CNTRL BYND (GAUZE/BANDAGES/DRESSINGS) ×2
CHLORAPREP W/TINT 26ML (MISCELLANEOUS) ×2 IMPLANT
CLAMP CORD UMBIL (MISCELLANEOUS) IMPLANT
CLOSURE STERI STRIP 1/2 X4 (GAUZE/BANDAGES/DRESSINGS) ×1 IMPLANT
CLOTH BEACON ORANGE TIMEOUT ST (SAFETY) ×2 IMPLANT
DRAPE C SECTION CLR SCREEN (DRAPES) ×2 IMPLANT
DRESSING PREVENA PLUS CUSTOM (GAUZE/BANDAGES/DRESSINGS) IMPLANT
DRSG OPSITE POSTOP 4X10 (GAUZE/BANDAGES/DRESSINGS) ×2 IMPLANT
DRSG PREVENA PLUS CUSTOM (GAUZE/BANDAGES/DRESSINGS) ×2
ELECT REM PT RETURN 9FT ADLT (ELECTROSURGICAL) ×2
ELECTRODE REM PT RTRN 9FT ADLT (ELECTROSURGICAL) ×1 IMPLANT
EXTRACTOR VACUUM M CUP 4 TUBE (SUCTIONS) IMPLANT
GLOVE BIOGEL PI IND STRL 7.0 (GLOVE) ×2 IMPLANT
GLOVE BIOGEL PI INDICATOR 7.0 (GLOVE) ×2
GLOVE ECLIPSE 6.5 STRL STRAW (GLOVE) ×2 IMPLANT
GOWN STRL REUS W/TWL LRG LVL3 (GOWN DISPOSABLE) ×4 IMPLANT
KIT ABG SYR 3ML LUER SLIP (SYRINGE) IMPLANT
KIT PREVENA INCISION MGT20CM45 (CANNISTER) ×1 IMPLANT
NDL HYPO 25X5/8 SAFETYGLIDE (NEEDLE) IMPLANT
NEEDLE HYPO 22GX1.5 SAFETY (NEEDLE) ×2 IMPLANT
NEEDLE HYPO 25X5/8 SAFETYGLIDE (NEEDLE) IMPLANT
NS IRRIG 1000ML POUR BTL (IV SOLUTION) ×2 IMPLANT
PACK C SECTION WH (CUSTOM PROCEDURE TRAY) ×2 IMPLANT
PAD OB MATERNITY 4.3X12.25 (PERSONAL CARE ITEMS) ×2 IMPLANT
RTRCTR C-SECT PINK 25CM LRG (MISCELLANEOUS) IMPLANT
STRIP CLOSURE SKIN 1/2X4 (GAUZE/BANDAGES/DRESSINGS) IMPLANT
SUT CHROMIC GUT AB #0 18 (SUTURE) IMPLANT
SUT MNCRL 0 VIOLET CTX 36 (SUTURE) ×3 IMPLANT
SUT MON AB 2-0 SH 27 (SUTURE)
SUT MON AB 2-0 SH27 (SUTURE) IMPLANT
SUT MON AB 3-0 SH 27 (SUTURE)
SUT MON AB 3-0 SH27 (SUTURE) IMPLANT
SUT MON AB 4-0 PS1 27 (SUTURE) IMPLANT
SUT MONOCRYL 0 CTX 36 (SUTURE) ×4
SUT PLAIN 2 0 (SUTURE)
SUT PLAIN 2 0 XLH (SUTURE) IMPLANT
SUT PLAIN ABS 2-0 CT1 27XMFL (SUTURE) IMPLANT
SUT VIC AB 0 CT1 36 (SUTURE) ×4 IMPLANT
SUT VIC AB 2-0 CT1 27 (SUTURE) ×2
SUT VIC AB 2-0 CT1 TAPERPNT 27 (SUTURE) ×1 IMPLANT
SUT VIC AB 4-0 PS2 27 (SUTURE) IMPLANT
SYR CONTROL 10ML LL (SYRINGE) ×2 IMPLANT
TOWEL OR 17X24 6PK STRL BLUE (TOWEL DISPOSABLE) ×2 IMPLANT
TRAY FOLEY W/BAG SLVR 14FR LF (SET/KITS/TRAYS/PACK) IMPLANT
WATER STERILE IRR 1000ML POUR (IV SOLUTION) ×2 IMPLANT

## 2019-07-11 NOTE — Anesthesia Postprocedure Evaluation (Signed)
Anesthesia Post Note  Patient: Janet Bowen  Procedure(s) Performed: CESAREAN SECTION (N/A )     Patient location during evaluation: PACU Anesthesia Type: Spinal Level of consciousness: awake and alert and oriented Pain management: pain level controlled Vital Signs Assessment: post-procedure vital signs reviewed and stable Respiratory status: spontaneous breathing, nonlabored ventilation and respiratory function stable Cardiovascular status: blood pressure returned to baseline Postop Assessment: no apparent nausea or vomiting, spinal receding, no headache and no backache Anesthetic complications: no                  Brennan Bailey

## 2019-07-11 NOTE — Progress Notes (Signed)
Notified ultrasound tech to perform ultrasound at bedside.

## 2019-07-11 NOTE — Brief Op Note (Signed)
07/11/2019  6:24 PM  PATIENT:  Janet Bowen  38 y.o. female  PRE-OPERATIVE DIAGNOSIS:  NRFHT, PPROM, Breech, class A1 GDM, Fibroid uterus, chronic HTN, IUP @27  6/7 weeks  POST-OPERATIVE DIAGNOSIS:  same  PROCEDURE:  Primary Cesarean section, Classical hysterotomy, removal of cervical cerclage  SURGEON:  Surgeon(s) and Role:    * Servando Salina, MD - Primary    * Azucena Fallen, MD - Assisting  PHYSICIAN ASSISTANT:   ASSISTANTS: Azucena Fallen, MD   ANESTHESIA:   spinal  EBL:  627 ml  findings: frank breech, (+) malodor no amniotic fluid, yellow cord, ant placenta, posterior adhesions on serosal surface of uterus, peritubal and ovaries adherent to posterior uterus, IM/SS fibroids, ant cervical cerclage knot  BLOOD ADMINISTERED:none  DRAINS: none   LOCAL MEDICATIONS USED:  NONE  SPECIMEN:  Source of Specimen:  placenta  DISPOSITION OF SPECIMEN:  PATHOLOGY  COUNTS:  YES  TOURNIQUET:  * No tourniquets in log *  DICTATION: .Other Dictation: Dictation Number [id number]  PLAN OF CARE: Admit to inpatient   PATIENT DISPOSITION:  PACU - hemodynamically stable.   Delay start of Pharmacological VTE agent (>24hrs) due to surgical blood loss or risk of bleeding: no

## 2019-07-11 NOTE — Progress Notes (Signed)
Ultrasound tech present at bedside

## 2019-07-11 NOTE — Lactation Note (Signed)
This note was copied from a baby's chart. Lactation Consultation Note  Patient Name: Janet Bowen Today's Date: 07/11/2019   Attempted to visit mom but she was in the bathroom. Her NT let LC know that mom will be going to the NICU to visit her baby when she's done and would prefer to see lactation another time. LC to follow up in the AM to do initial assessment.  Maternal Data    Feeding    LATCH Score                   Interventions    Lactation Tools Discussed/Used     Consult Status      [name] 07/11/2019, 10:31 PM

## 2019-07-11 NOTE — Progress Notes (Signed)
27 6/7 wks PPROM  Breech Fibroid uterus Class A1 GDM diet  S: still leaking fluid No ctx or abdominal pain Good FM  O: BP 115/64 (BP Location: Right Arm)   Pulse 91   Temp 98.2 F (36.8 C) (Oral)   Resp 18   Ht 5' 6.5" (1.689 m)   Wt 103.3 kg   LMP 12/28/2018   SpO2 98%   BMI 36.21 kg/m   CBG (last 3)  Recent Labs    07/10/19 1759 07/10/19 2105 07/11/19 0627  GLUCAP 103* 120* 90  Tracing + not as yet this am  IMP: PPROM Breech @ 27 6/7 wk Chronic HTN controlled on med Fibroid uterus Class A1 GDm diet controlled  P)  Repeat BPP in am. Await monitoring. Cont inpt

## 2019-07-11 NOTE — Anesthesia Procedure Notes (Signed)
Spinal  Patient location during procedure: OR Start time: 07/11/2019 4:43 PM End time: 07/11/2019 4:45 PM Staffing Anesthesiologist: Brennan Bailey, MD Performed: anesthesiologist  Preanesthetic Checklist Completed: patient identified, pre-op evaluation, timeout performed, IV checked, risks and benefits discussed and monitors and equipment checked Spinal Block Patient position: sitting Prep: site prepped and draped and DuraPrep Patient monitoring: heart rate, continuous pulse ox and blood pressure Approach: midline Location: L3-4 Injection technique: single-shot Needle Needle type: Pencan  Needle gauge: 24 G Needle length: 10 cm Assessment Sensory level: T4 Additional Notes Risks, benefits, and alternative discussed. Patient gave consent to procedure. Prepped and draped in sitting position. Clear CSF obtained after one needle pass. Positive terminal aspiration. No pain or paraesthesias with injection. Patient tolerated procedure well. Vital signs stable. Janet Asal, MD

## 2019-07-11 NOTE — Transfer of Care (Signed)
Immediate Anesthesia Transfer of Care Note  Patient: Janet Bowen  Procedure(s) Performed: CESAREAN SECTION (N/A )  Patient Location: PACU  Anesthesia Type:Spinal  Level of Consciousness: awake, alert  and oriented  Airway & Oxygen Therapy: Patient Spontanous Breathing  Post-op Assessment: Report given to RN and Post -op Vital signs reviewed and stable  Post vital signs: Reviewed and stable  Last Vitals:  Vitals Value Taken Time  BP 132/67 07/11/19 1830  Temp    Pulse 81 07/11/19 1834  Resp 11 07/11/19 1834  SpO2 100 % 07/11/19 1834  Vitals shown include unvalidated device data.  Last Pain:  Vitals:   07/11/19 1609  TempSrc: Oral  PainSc:       Patients Stated Pain Goal: 3 ([id number])  Complications: No apparent anesthesia complications

## 2019-07-11 NOTE — Anesthesia Preprocedure Evaluation (Signed)
Anesthesia Evaluation  Patient identified by MRN, date of birth, ID band Patient awake    Reviewed: Allergy & Precautions, NPO status , Patient's Chart, lab work & pertinent test results, reviewed documented beta blocker date and time   History of Anesthesia Complications Negative for: history of anesthetic complications  Airway Mallampati: II  TM Distance: >3 FB Neck ROM: Full    Dental no notable dental hx.    Pulmonary former smoker,    Pulmonary exam normal        Cardiovascular hypertension, Pt. on medications and Pt. on home beta blockers Normal cardiovascular exam     Neuro/Psych negative neurological ROS  negative psych ROS   GI/Hepatic negative GI ROS, Neg liver ROS,   Endo/Other  diabetes, Gestational  Renal/GU negative Renal ROS  negative genitourinary   Musculoskeletal negative musculoskeletal ROS (+)   Abdominal   Peds negative pediatric ROS (+)  Hematology negative hematology ROS (+)   Anesthesia Other Findings   Reproductive/Obstetrics (+) Pregnancy                             Anesthesia Physical Anesthesia Plan  ASA: II and emergent  Anesthesia Plan: Spinal   Post-op Pain Management:    Induction:   PONV Risk Score and Plan: Treatment may vary due to age or medical condition, Ondansetron and Dexamethasone  Airway Management Planned: Natural Airway  Additional Equipment:   Intra-op Plan:   Post-operative Plan:   Informed Consent: I have reviewed the patients History and Physical, chart, labs and discussed the procedure including the risks, benefits and alternatives for the proposed anesthesia with the patient or authorized representative who has indicated his/her understanding and acceptance.     Dental advisory given  Plan Discussed with: CRNA  Anesthesia Plan Comments: (Code cesarean for NRFHT, breech, PPROM. Pt ate lunch at 3pm today. Discussed plan  for spinal anesthesia with surgeon and patient. Daiva Huge, MD)        Anesthesia Quick Evaluation

## 2019-07-11 NOTE — Progress Notes (Signed)
Late entry Called by RN regarding repetitive variable decelerations occurring every 4- 5 mins with a previous prolonged decel 10mins earlier that lasted 2 mins. Pt had returned from ultrasound with still anhydramnios. Wbc was normal  on arrival, pt had just had another  Prolonged.deceleration with FHR down to 125 and persisting low but not back to full baseline of 150's. IV access was being placed.  FHR baseline noted then decel down to 80's started again. OR notified for code cesarean section. C/S explained and indication. To OR

## 2019-07-12 ENCOUNTER — Encounter (HOSPITAL_COMMUNITY): Payer: Self-pay | Admitting: Obstetrics and Gynecology

## 2019-07-12 DIAGNOSIS — O9902 Anemia complicating childbirth: Secondary | ICD-10-CM | POA: Diagnosis present

## 2019-07-12 LAB — CBC
HCT: 28.4 % — ABNORMAL LOW (ref 36.0–46.0)
Hemoglobin: 9.7 g/dL — ABNORMAL LOW (ref 12.0–15.0)
MCH: 29.4 pg (ref 26.0–34.0)
MCHC: 34.2 g/dL (ref 30.0–36.0)
MCV: 86.1 fL (ref 80.0–100.0)
Platelets: 197 10*3/uL (ref 150–400)
RBC: 3.3 MIL/uL — ABNORMAL LOW (ref 3.87–5.11)
RDW: 11.8 % (ref 11.5–15.5)
WBC: 9.3 10*3/uL (ref 4.0–10.5)
nRBC: 0 % (ref 0.0–0.2)

## 2019-07-12 MED ORDER — MAGNESIUM OXIDE 400 (241.3 MG) MG PO TABS
400.0000 mg | ORAL_TABLET | Freq: Every day | ORAL | Status: DC
  Administered 2019-07-12 – 2019-07-14 (×3): 400 mg via ORAL
  Filled 2019-07-12 (×3): qty 1

## 2019-07-12 MED ORDER — POLYSACCHARIDE IRON COMPLEX 150 MG PO CAPS
150.0000 mg | ORAL_CAPSULE | Freq: Every day | ORAL | Status: DC
  Administered 2019-07-12 – 2019-07-14 (×3): 150 mg via ORAL
  Filled 2019-07-12 (×3): qty 1

## 2019-07-12 NOTE — Lactation Note (Signed)
This note was copied from a baby's chart. Lactation Consultation Note  Patient Name: Janet Bowen [id number] Date: 07/12/2019 Reason for consult: Initial assessment;Primapara;1st time breastfeeding;NICU baby;Preterm <34wks Baby is 60 hours old  Greer visited mom in her room 108.  Mom mentioned she has only has pumped with the DEBP once since it was set up with no results And was shown how to hand express .  LC reassured mom is can be a slow process and it takes time.  LC reviewed supply / demand and encouraged mom to be consistent pumping both breast 15 -20 mins around The clock ( 8-10 times ) and save milk.  Per mom active with [facility] and [facility] faxed a request for a DEBP today and mom aware to call [facility] and the fax was being sent.   Maternal Data Has patient been taught Hand Expression?: Yes  Feeding    LATCH Score                   Interventions Interventions: Breast feeding basics reviewed  Lactation Tools Discussed/Used Tools: Pump Breast pump type: Double-Electric Breast Pump WIC Program: Yes Pump Review: Milk Storage Date initiated:: 07/11/19   Consult Status Consult Status: Follow-up Date: 07/13/19 Follow-up type: In-patient    [place] 07/12/2019, 2:50 PM

## 2019-07-12 NOTE — Clinical Social Work Maternal (Signed)
CLINICAL SOCIAL WORK MATERNAL/CHILD NOTE  Patient Details  Name: Janet Bowen MRN: [id number] Date of Birth: 04/05/1981  Date:  07/12/2019  Clinical Social Worker Initiating Note:  Adabella Jalen Daluz, LCSW     Date/Time: Initiated:  07/12/19/1213             Child's Name:  Aviance Sloan   Biological Parents:  Mother, Father(Father: Antonio Sloan)   Need for Interpreter:  None   Reason for Referral:  Parental Support of Premature Babies < 32 weeks/or Critically Ill babies, Current Substance Use/Substance Use During Pregnancy    Address:  [address]  [place] [address]    Phone number:  [phone] (home)     Additional phone number:  Household Members/Support Persons (HM/SP):   Household Member/Support Person 1, Household Member/Support Person 2, Household Member/Support Person 3, Household Member/Support Person 4   HM/SP Name Relationship DOB or Age  HM/SP -1 Antonio Sloan FOB    HM/SP -2 Shanden Sloan step son 17 years old  HM/SP -3 Thairaune Sloan step son 10 years old  HM/SP -4 Tysheed Sloan step son 6 years old  HM/SP -5        HM/SP -6        HM/SP -7        HM/SP -8          Natural Supports (not living in the home): Parent(Mother: Vivian Banet)   Professional Supports:None   Employment:Full-time   Type of Work: Senior Health Advocate   Education:  College graduate   Homebound arranged:    Financial Resources:Private Insurance   Other Resources: WIC   Cultural/Religious Considerations Which May Impact Care:   Strengths: Ability to meet basic needs , Understanding of illness   Psychotropic Medications:         Pediatrician:       Pediatrician List:   [place]    [place]    [place]    [place]    [place]    [place]      Pediatrician Fax Number:    Risk Factors/Current Problems: None   Cognitive State:     Mood/Affect: Relaxed , Interested , Calm     CSW Assessment:CSW met with MOB at bedside to discuss infant's NICU admission and substance use during pregnancy, MOB's mother present and MOB on facetime with FOB during assessment. CSW introduced self and explained reason for consult. MOB was welcoming, pleasant and engaged during assessment. MOB reported that she resides with FOB and three older children. 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 MOB reported that she receives WIC. MOB reported that she has not started shopping for infant but does not anticipate needing help obtaining items for infant. CSW inquired about MOB's support system, MOB reported that her mother is her support.   CSW and MOB discussed infant's NICU admission. MOB reported that it has been good and she feels well informed. CSW informed MOB about the NICU, what to expect and resources/supports available while infant is admitted to the NICU. MOB denied any questions/concerns.  CSW asked MOB's mother to leave the room to speak with MOB privately, MOB's mother left the room voluntarily. CSW asked MOB if she wanted to end face time call, MOB declined to end face time call and granted CSW verbal permission to speak about anything while she was on face time with FOB. CSW inquired about MOB's mental health history, MOB denied any   mental health history. MOB presented calm and did not demonstrate any acute mental health signs/symptoms. CSW assessed for safety, MOB denied SI and HI.   CSW provided education regarding the baby blues period vs. perinatal mood disorders, discussed treatment and gave resources for mental health follow up if concerns arise.  CSW recommends self-evaluation during the postpartum time period using the New Mom Checklist from Postpartum Progress and encouraged MOB to contact a medical professional if symptoms are noted at any time.    CSW informed MOB that infant qualifies for SSI and explained the process to  apply, MOB verbalized plan to apply.   CSW informed MOB about hospital drug policy due to substance use during pregnancy. MOB reported that she smoked marijuana and stopped when she found out that she was pregnant. MOB reported that her last use was seven months ago and denied any other substance use during pregnancy. CSW informed MOB that infant's UDS was negative and CDS would continue to be monitored and a CPS report would be made if warranted. MOB denied any questions and verbalized understanding.   CSW will continue to offer resources/supports while infant is admitted to the NICU.   CSW Plan/Description: Perinatal Mood and Anxiety Disorder (PMADs) Education, Hospital Drug Screen Policy Information, CSW Will Continue to Monitor Umbilical Cord Tissue Drug Screen Results and Make Report if Warranted, Supplemental Security Income (SSI) Information, Other Patient/Family Education    Brenly L Winnona Wargo, LCSW 07/12/2019, 12:16 PM           

## 2019-07-12 NOTE — Progress Notes (Addendum)
Patient ID: Janet Bowen, female   DOB: May 12, 1981, 38 y.o.   MRN: [id number] Subjective: POD# 1 Live born female  Birth Weight: 2 lb 1.5 oz (950 g) APGAR: 1, 5  Newborn Delivery   Birth date/time: 07/11/2019 16:57:00 Delivery type: C-Section, Classical Trial of labor: No C-section categorization: Primary     Baby name: Aviance Delivering provider: COUSINS, SHERONETTE   Feeding: breast/pumping  Pain control at delivery: Spinal   Reports feeling well, ambulated to NICU for visit with newborn, was feeling well for a bit then became fatigued and pain worsened. Returned to room to rest and now feels better.   Patient reports tolerating PO.   Breast symptoms:none Pain controlled with PO meds. Denies HA/SOB/C/P/N/V/dizziness. Flatus present. She reports vaginal bleeding as normal, without clots.  She is ambulating, urinating without difficulty.     Objective:   VS:    Vitals:   07/11/19 2355 07/12/19 0346 07/12/19 0700 07/12/19 0759  BP: 118/71 (!) 95/55 128/74 (!) 106/59  Pulse: 83 67 (!) 105 83  Resp: 15 17 (!) 34 17  Temp: 98 F (36.7 C) 97.7 F (36.5 C) 98.1 F (36.7 C) 98.6 F (37 C)  TempSrc: Oral Oral Oral Oral  SpO2:  99% 93% 96%  Weight:      Height:          Intake/Output Summary (Last 24 hours) at 07/12/2019 1014 Last data filed at 07/12/2019 0931 Gross per 24 hour  Intake 3180 ml  Output 1952 ml  Net 1228 ml        Recent Labs    07/11/19 1603 07/12/19 0553  WBC 7.7 9.3  HGB 11.4* 9.7*  HCT 33.0* 28.4*  PLT 229 197     Blood type: --/--/B POS (09/05 1100)  Rubella:   immune Vaccines: TDaP pending         Flu    pending   Physical Exam:  General: alert, cooperative and no distress CV: Regular rate and rhythm Resp: clear Abdomen: soft, nontender, normal bowel sounds Incision: clean, dry, intact and Prevena dressing patent Uterine Fundus: firm, below umbilicus, surgically tender Lochia: minimal Ext: extremities normal, atraumatic, no  cyanosis or edema      Assessment/Plan: 38 y.o.   POD# 1. [id number]                  Principal Problem:   Postpartum care following cesarean delivery  9/6 Active Problems:   Cervical incompetence affecting management of pregnancy in second trimester, antepartum   Preterm premature rupture of membranes, antepartum   White classification A1 gestational diabetes mellitus (GDM), diet controlled   Benign essential hypertension affecting pregnancy in second trimester  - BP stable on labetalol 200 mg BID  - no s/sx superimpose PEC   Maternal anemia, with delivery - asymptomatic  - started oral Fe and Mag Ox  Doing well, stable.    TdaP and flu vaccine inpatient           Advance diet as tolerated Encourage rest when baby rests Breastfeeding support Encourage to ambulate Routine post-op care  Juliene Pina, CNM, MSN 07/12/2019, 10:14 AM

## 2019-07-12 NOTE — Op Note (Signed)
NAME: Janet Bowen, Janet Bowen MEDICAL RECORD [id number] ACCOUNT [id number] DATE OF BIRTH:Mar 26, 1981 FACILITY: [facility] LOCATION: [facility] PHYSICIAN:Fintan Grater A. Skyelar Swigart, MD  OPERATIVE REPORT  DATE OF PROCEDURE:  07/11/2019  PREOPERATIVE DIAGNOSES:  Nonreassuring fetal tracing.  Cervical incompetence.  Breech presentation.  Class A1 gestational diabetes.  Chronic hypertension in the second trimester.  Intrauterine gestation at 27-6/7 weeks, Fibroid uterus, AMA.  PROCEDURE:  Emergency primary cesarean section, classical hysterotomy, removal of cervical cerclage.  POSTOPERATIVE DIAGNOSIS:  Nonreassuring fetal tracing.  Cervical incompetence.  Breech presentation.  Class A1 gestational diabetes.  Chronic hypertension in the second trimester.  Intrauterine gestation at 27-6/7 weeks, Fibroid uterus, AMA .  ANESTHESIA:  Spinal.  SURGEON:  Servando Salina, MD  ASSISTANT: Lars Pinks, CNM  DESCRIPTION OF PROCEDURE:  Under adequate spinal anesthesia, the patient was placed in the supine position.  The spinal anesthesia was done due to the fetal heart rate on arrival in the operating room was 134 beats/min.  The patient was then sterilely prepped  and draped in the usual fashion.  Indwelling Foley catheter was sterilely placed.  A Pfannenstiel skin incision was then made, carried down to the rectus fascia.  Rectus fascia was opened transversely.  Rectus fascia was then bluntly and sharply  dissected off the rectus muscle in a superior and inferior fashion.  The rectus muscle was split in the midline.  The parietal peritoneum was entered sharply and extended.  A self-retaining Alexis retractor was then placed.  An undeveloped lower uterine  segment uterus was encountered.  A vertical uterine incision was then made and extended down until the cavity was reached.  Then incision was then extended with bandage scissors superiorly and inferiorly.  Anterior placenta was encountered.  Gentle  removal of a  live female from a frank breech position was accomplished.  The cord was quickly clamped, cut.  Baby was transferred to the awaiting pediatricians, was assigned Apgars of 1, 5 and 7 at one, five, and ten minutes.  Placenta was manually  removed.  Uterine cavity was visualized and cleaned of debris.  Uterine incision was closed in several layers using 0 Monocryl stitches until the serosal surface was reached, at which time 2-0 Monocryl suture was used in a baseball fashion.  Several  interrupted horizontal mattress sutures were then placed for hemostasis at the tip of the lower segment of the incision.  With good hemostasis noted, the uterus was exteriorized.  A fundal serosal and several intramural fibroids were noted.  Posteriorly,  there was adherence of the ovaries to the posterior wall, filmy adhesions which were lysed off of the posterior wall of the uterus, and the fallopian tube was done.  Both also had surrounding peritubal adhesions which were lysed.  The abdomen was  irrigated and suctioned of debris.  The uterus was then returned to the abdomen.  Irrigation again was done and suction of debris.  Interceed was placed overlying the incision.  The Alexis retractor was removed.  The parietal peritoneum was then closed  with 2-0 Vicryl.  The rectus fascia was closed with 0 Vicryl x2.  The subcutaneous area was irrigated, small bleeders cauterized.  Interrupted 2-0 plain sutures placed, and the skin approximated with 4-0 Vicryl subcuticular closure.  Steri-Strips and  benzoin were placed, as was the Prevena wound vacuum for the incision was placed.  The patient was then placed in the stirrups.  A bivalve speculum was placed in the vagina.  The cerclage knot was identified, visualized, and cut with removal  of the Mersilene tape.  Vagina was irrigated.  The cerclage suture site was without any bleeding.  The patient was taken out of the stirrups.  SPECIMEN:  Placenta sent to pathology.  Cord pH and cord  gases were then obtained.  ESTIMATED BLOOD LOSS:  627 mL.  INTRAOPERATIVE FLUIDS:  2700 mL.  URINE OUTPUT:  200 mL.  COUNTS:  Sponge and instrument counts x2 were correct.  COMPLICATIONS:  None.  DISPOSITION:  The patient tolerated the procedure well and was transferred to recovery room in stable condition. Baby was transferred to neonatal intensive care unit due to prematurity.  LN/NUANCE  D:07/12/2019 T:07/12/2019 JOB:[id number]

## 2019-07-13 MED ORDER — MAGNESIUM SULFATE BOLUS VIA INFUSION
4.0000 g | Freq: Once | INTRAVENOUS | Status: DC
  Filled 2019-07-13: qty 500

## 2019-07-13 MED ORDER — MAGNESIUM SULFATE 40 G IN LACTATED RINGERS - SIMPLE: 2.0000 g/h | INTRAVENOUS | Status: DC

## 2019-07-13 NOTE — Lactation Note (Signed)
This note was copied from a baby's chart. Lactation Consultation Note  Patient Name: Janet Bowen [id number] Date: 07/13/2019 Reason for consult: Follow-up assessment  LC Follow Up Visit:  Attempted to visit with mother, however, she was in the NICU.  Will attempt to return later today or RN can call for any questions/concerns.    Consult Status Consult Status: Follow-up Date: 07/13/19 Follow-up type: In-patient    Silas Sedam R Virna Livengood 07/13/2019, 10:12 AM

## 2019-07-13 NOTE — Lactation Note (Signed)
This note was copied from a baby's chart. Lactation Consultation Note  Patient Name: Janet Bowen [id number] Date: 07/13/2019 Reason for consult: Follow-up assessment;Preterm <34wks;NICU baby;1st time breastfeeding;Infant < 6lbs  P4 mother whose infant is now 56 hours old.  This is a preterm baby born at 27+6 weeks weighing < 6 lbs and in the NICU.  This is mother's first time breast feeding.  Mother had no questions/concerns related to breast feeding or pumping.  When asked how often she has been pumping, mother replied, "I'm gonna start that today."  Explained how milk "comes to volume" and encouraged mother to start now and to be diligent about pumping with the DEBP at least every three hours.  Asked her if she needed help with the pump but she told me "someone set it up."  Encouraged to do hand expression before/after pumping to help with milk supply.  Mother has colostrum containers at bedside.  Provided emotional support and mother stated that baby was doing "pretty well."  She received the NICU booklet yesterday.  Mother will pick up her DEBP from [facility] after hospital discharge tomorrow.  Encouraged her to call for [facility] as needed.  Mother had a support person with her in the room.     Maternal Data Formula Feeding for Exclusion: No Has patient been taught Hand Expression?: Yes Does the patient have breastfeeding experience prior to this delivery?: No  Feeding    LATCH Score                   Interventions    Lactation Tools Discussed/Used WIC Program: Yes   Consult Status Consult Status: Follow-up Date: 07/14/19 Follow-up type: In-patient    Janet Bowen 07/13/2019, 12:50 PM

## 2019-07-13 NOTE — Progress Notes (Signed)
POSTOPERATIVE DAY # 2 S/P Primary Classical C/S for nonreassuring fetal tracing, breech presentation, A1GDM, CHTN, Cervical incompetence, baby girl "Aviance" in NICU  S:         Reports feeling better today             Tolerating po intake / no nausea / no vomiting / + flatus / + BM x1   Denies HA, visual changes, RUQ/epigastric pain   Denies dizziness, SOB, or CP             Bleeding is light             Pain controlled with Tylenol and Motrin             Up ad lib / ambulatory/ voiding QS  Newborn in NICU - per patient, baby is stable, going to receive blood transfusion today. Plans to start pumping - reports she pumped the first day, but was too tired yesterday. Did not see any colostrum. Reports a hx of pituitary tumor and was on Cabergoline prior to pregnancy, but reports levels were low?. Questions if it will affect her milk supply.    O:  VS: BP 131/84 (BP Location: Right Arm)   Pulse 82   Temp 98.1 F (36.7 C) (Oral)   Resp 18   Ht 5' 6.5" (1.689 m)   Wt 103.3 kg   LMP 12/28/2018   SpO2 98%   BMI 36.21 kg/m  07/13/19 0811  98.1 F (36.7 C)  82  -  18  131/84  Sitting  98 %  Room Air  - DG   07/13/19 0550  98.1 F (36.7 C)  80  -  18  120/78  Lying  94 %  -  - AC   07/12/19 2316  98.4 F (36.9 C)  107Abnormal   -  18  112/50Abnormal   Semi-fowlers  94 %  -  - AC   07/12/19 2100  99.7 F (37.6 C)  -  -  -  -  -  -  -  - AS   07/12/19 1951  101 F (38.3 C)Abnormal   -  -  -  -  -  -  -  - CH   07/12/19 1930  102.5 F (39.2 C)Abnormal   -  -  -  -  -  -  -  - CH   07/12/19 1921  100.6 F (38.1 C)Abnormal   102Abnormal   -  18  130/74  Semi-fowlers  -  -  - AS   07/12/19 1851  -  107Abnormal   -  -  139/85  -  -  -  - AS   07/12/19 1850  -  103Abnormal   -  26Abnormal   139/85  -  98 %  Room Air  - SM   07/12/19 1845  98.9 F (37.2 C)  107Abnormal   -  28Abnormal   139/89  -  100 %  Room Air  - SM      LABS:               Recent Labs    07/11/19 1603 07/12/19 0553   WBC 7.7 9.3  HGB 11.4* 9.7*  PLT 229 197               Bloodtype: --/--/B POS (09/05 1100)  Rubella:  I&O: Intake/Output      09/07 0701 - 09/08 0700 09/08 0701 - 09/09 0700   P.O.     I.V. (mL/kg)     Total Intake(mL/kg)     Urine (mL/kg/hr) 800 (0.3)    Blood     Total Output 800    Net -800                      Physical Exam:             Alert and Oriented X3  Lungs: Clear and unlabored  Heart: regular rate and rhythm / no murmurs  Abdomen: soft, appropriate tenderness, non-distended, hypoactive bowel sounds in all quadr             Fundus: firm, non-tender, U-3             Dressing: Prevena wound vac, no drainage, seal intact              Incision:  approximated with sutures unable to visualize due to wound vac  Perineum: intact  Lochia: small, rubra noted  Extremities: trace LE edema, no calf pain or tenderness,   A:        POD # 2 S/P Primary Classical C/S            Mild ABL Anemia compounding chronic IDA   - stable on oral FE  Class A1 GDM   - stable, delivered   - Plan f/u PP  Chronic Hypertension   - on Labetalol 200mg  BID  Patient reported hx of pituitary tumor prior Cabergoline use   - okay to continue pumping; discussed will likely have increased milk production    - Does not need to restart Cabergoline during breastfeeding   ?Febrile yesterday   - No s/s of infection, no further fevers noted   Routine postoperative care              See lactation   Plan for d/c home tomorrow; needs appt Friday for Prevena wound vac removal   Lars Pinks, MSN, CNM [facility]

## 2019-07-14 MED ORDER — IBUPROFEN 800 MG PO TABS: 800.0000 mg | ORAL_TABLET | Freq: Three times a day (TID) | ORAL | 0 refills | Status: DC

## 2019-07-14 MED ORDER — OXYCODONE HCL 5 MG PO TABS: 5.0000 mg | ORAL_TABLET | ORAL | 0 refills | Status: DC | PRN

## 2019-07-14 MED ORDER — LABETALOL HCL 200 MG PO TABS: 200.0000 mg | ORAL_TABLET | Freq: Two times a day (BID) | ORAL | 2 refills | Status: DC

## 2019-07-14 MED ORDER — TETANUS-DIPHTH-ACELL PERTUSSIS 5-2.5-18.5 LF-MCG/0.5 IM SUSP: 0.5000 mL | Freq: Once | INTRAMUSCULAR | Status: DC

## 2019-07-14 MED ORDER — TETANUS-DIPHTH-ACELL PERTUSSIS 5-2.5-18.5 LF-MCG/0.5 IM SUSP
0.5000 mL | Freq: Once | INTRAMUSCULAR | Status: AC
  Administered 2019-07-14: 0.5 mL via INTRAMUSCULAR
  Filled 2019-07-14: qty 0.5

## 2019-07-14 NOTE — Progress Notes (Addendum)
HD 18 Pod 3 s/p classical c/s at 27.6 wga  No c/o, ready for d/c home; baby in NICU, mom pumping;  No sob/cp, no n/v, tol po, ambulating, passing flatus, voiding w/o difficulty No other c/o; denies h/a, vision changes, ruq pain Normal lochia; pain controlled   Temp:  [97.7 F (36.5 C)-98 F (36.7 C)] 97.7 F (36.5 C) (09/09 0412) Pulse Rate:  [68-81] 81 (09/09 0412) Resp:  [18] 18 (09/09 0412) BP: (117-139)/(62-83) 117/62 (09/09 0412) SpO2:  [95 %-98 %] 97 % (09/09 0412)  A&ox3 rrr ctab Abd: soft, nt,nd; fundus firm and 2cm below umb; provena dressing intact LE: tr edema, nt bilat  CBC Latest Ref Rng & Units 07/12/2019 07/11/2019 06/28/2019  WBC 4.0 - 10.5 K/uL 9.3 7.7 7.9  Hemoglobin 12.0 - 15.0 g/dL 9.7(L) 11.4(L) 10.6(L)  Hematocrit 36.0 - 46.0 % 28.4(L) 33.0(L) 31.7(L)  Platelets 150 - 400 K/uL 197 229 266   A/P: pod 3 s/p classical c/s for nrfht 1. Doing well, d/c home today; plan bp check and wound check in office in 3 days 2. CHTN - contin labetalol 3. Rubella immune 4. Infant in NICU- on vent; mom says baby "doing well" 5. gdma1 - plan f/u as outpatient 6. Chronic anemia with acute change - asymptomatic, plan iron q day

## 2019-07-14 NOTE — Discharge Summary (Signed)
Obstetric Discharge Summary Reason for Admission: PPROM Prenatal Procedures: cerclage (05/04/19) Intrapartum Procedures: cesarean: classical, stat, d/t NRFHT at 27.6wga Postpartum Procedures: none Complications-Operative and Postpartum: none Hemoglobin  Date Value Ref Range Status  07/12/2019 9.7 (L) 12.0 - 15.0 g/dL Final   HCT  Date Value Ref Range Status  07/12/2019 28.4 (L) 36.0 - 46.0 % Final    Physical Exam:  General: alert, cooperative and no distress Lochia: appropriate Uterine Fundus: firm Incision: healing well DVT Evaluation: No evidence of DVT seen on physical exam.  Discharge Diagnoses: preterm, delivered; gdma1, obesity, chtn  Discharge Information: Date: 07/14/2019 Activity: pelvic rest; lifting restrictions Diet: regular Medications: oxycodone, iron, ibuprofen Condition: stable Instructions: refer to practice specific booklet Discharge to: home Follow-up Information    Servando Salina, MD Follow up.   Specialty: Obstetrics and Gynecology Contact information: [address] [place] [address] [phone]           Newborn Data: Live born female  Birth Weight: 2 lb 1.5 oz (950 g) APGAR: 1, 5  Newborn Delivery   Birth date/time: 07/11/2019 16:57:00 Delivery type: C-Section, Classical Trial of labor: No C-section categorization: Primary     Infant in NICU.  Charyl Bigger 07/14/2019, 10:09 AM

## 2019-08-03 ENCOUNTER — Ambulatory Visit: Payer: Self-pay

## 2019-08-03 NOTE — Lactation Note (Signed)
This note was copied from a baby's chart. Lactation Consultation Note  Patient Name: Janet Bowen [id number] Date: 08/03/2019   Spoke to [place] and she told LC that mom is pumping but not getting "much" EBM, baby is mostly on donor milk. Last feeding with mother's milk was tonight at midnight. Mom needs to be followed up to discuss supply and demand.   Maternal Data    Feeding Feeding Type: Donor Breast Milk  LATCH Score                   Interventions    Lactation Tools Discussed/Used     Consult Status      Junius Faucett Francene Boyers 08/03/2019, 10:22 PM

## 2019-08-10 ENCOUNTER — Ambulatory Visit: Payer: Self-pay

## 2019-08-10 NOTE — Lactation Note (Signed)
This note was copied from a baby's chart. Lactation Consultation Note  Patient Name: Janet Bowen [id number] Date: 08/10/2019 Reason for consult: Follow-up assessment;Mother's request;NICU baby;Preterm <34wks;Infant < 6lbs;Nipple pain/trauma  1654 - 1710- [name] called lactation to assess nipple pain following pumping. She is exclusively pumping for her daughter, who is in the NICU. [name] has a hospital grade DEBP at home as well as an Evenflo pump provided by insurance. She prefers the hospital grade pump.  [name] pumps 4 times a day (she does not pump at night). She pumps between 1-2 ounces of breast milk combined. She states that her nipples feel sore and tender all the time between pumps.  Based on a breast exam, I did not note any bruising or blanching of the skin around the base of her nipple. Her nipple was in tact and everted. [name] is using a size 24 flange, and her nipples appear to be too large for this size. I recommended sizing up to the 27 flanges and turning the pressure down a bit on her DEBP (she has the pressure turned almost to full capacity).  I provided her with comfort gels as well and recommended refrigeration prior to wearing them to help with the tenderness.  I encouraged [name] to follow up with lactation if these changes did not improve her comfort after several days. Lactation can observe her pump.  I discussed her pumping frequency and breast milk production. I encouraged her to pump 8 times a day to see if she can increase her production. [name] indicated that when baby is discharged from the NICU. I counseled [name] on the benefits of her breast milk (even in partial amounts) for preterm babies and encouraged her to continue providing breast milk after discharge, if able.   [name] thanked me for my time and indicated she would follow up if these interventions do not help.   Maternal Data Does the patient have breastfeeding experience  prior to this delivery?: No  Feeding Feeding Type: Breast Milk  LATCH Score                   Interventions Interventions: Breast feeding basics reviewed;DEBP  Lactation Tools Discussed/Used Tools: Flanges Flange Size: 27 Breast pump type: Double-Electric Breast Pump Pump Review: Setup, frequency, and cleaning   Consult Status Consult Status: PRN Follow-up type: Call as needed    Lenore Manner 08/10/2019, 5:12 PM

## 2019-12-29 ENCOUNTER — Other Ambulatory Visit: Payer: Self-pay

## 2019-12-29 ENCOUNTER — Encounter: Payer: Self-pay | Admitting: Internal Medicine

## 2019-12-29 ENCOUNTER — Ambulatory Visit (INDEPENDENT_AMBULATORY_CARE_PROVIDER_SITE_OTHER): Payer: 59 | Admitting: Internal Medicine

## 2019-12-29 VITALS — BP 140/92 | HR 88 | Temp 98.0°F | Ht 66.5 in | Wt 253.0 lb

## 2019-12-29 DIAGNOSIS — Z0000 Encounter for general adult medical examination without abnormal findings: Secondary | ICD-10-CM | POA: Diagnosis not present

## 2019-12-29 DIAGNOSIS — E282 Polycystic ovarian syndrome: Secondary | ICD-10-CM

## 2019-12-29 DIAGNOSIS — I10 Essential (primary) hypertension: Secondary | ICD-10-CM | POA: Diagnosis not present

## 2019-12-29 MED ORDER — OLMESARTAN MEDOXOMIL-HCTZ 20-12.5 MG PO TABS: 1.0000 | ORAL_TABLET | Freq: Every day | ORAL | 3 refills | Status: DC

## 2019-12-29 NOTE — Patient Instructions (Addendum)
We will have you come to the lab in the basement at [facility] for labs last week in March or first week in April. They are open from 7:30 AM to 5:30 PM M-F.    Health Maintenance, Female Adopting a healthy lifestyle and getting preventive care are important in promoting health and wellness. Ask your health care provider about:  The right schedule for you to have regular tests and exams.  Things you can do on your own to prevent diseases and keep yourself healthy. What should I know about diet, weight, and exercise? Eat a healthy diet   Eat a diet that includes plenty of vegetables, fruits, low-fat dairy products, and lean protein.  Do not eat a lot of foods that are high in solid fats, added sugars, or sodium. Maintain a healthy weight Body mass index (BMI) is used to identify weight problems. It estimates body fat based on height and weight. Your health care provider can help determine your BMI and help you achieve or maintain a healthy weight. Get regular exercise Get regular exercise. This is one of the most important things you can do for your health. Most adults should:  Exercise for at least 150 minutes each week. The exercise should increase your heart rate and make you sweat (moderate-intensity exercise).  Do strengthening exercises at least twice a week. This is in addition to the moderate-intensity exercise.  Spend less time sitting. Even light physical activity can be beneficial. Watch cholesterol and blood lipids Have your blood tested for lipids and cholesterol at 41 years of age, then have this test every 5 years. Have your cholesterol levels checked more often if:  Your lipid or cholesterol levels are high.  You are older than 41 years of age.  You are at high risk for heart disease. What should I know about cancer screening? Depending on your health history and family history, you may need to have cancer screening at various ages. This may include screening  for:  Breast cancer.  Cervical cancer.  Colorectal cancer.  Skin cancer.  Lung cancer. What should I know about heart disease, diabetes, and high blood pressure? Blood pressure and heart disease  High blood pressure causes heart disease and increases the risk of stroke. This is more likely to develop in people who have high blood pressure readings, are of African descent, or are overweight.  Have your blood pressure checked: ? Every 3-5 years if you are 69-30 years of age. ? Every year if you are 19 years old or older. Diabetes Have regular diabetes screenings. This checks your fasting blood sugar level. Have the screening done:  Once every three years after age 25 if you are at a normal weight and have a low risk for diabetes.  More often and at a younger age if you are overweight or have a high risk for diabetes. What should I know about preventing infection? Hepatitis B If you have a higher risk for hepatitis B, you should be screened for this virus. Talk with your health care provider to find out if you are at risk for hepatitis B infection. Hepatitis C Testing is recommended for:  Everyone born from [birth year] through 1967.  Anyone with known risk factors for hepatitis C. Sexually transmitted infections (STIs)  Get screened for STIs, including gonorrhea and chlamydia, if: ? You are sexually active and are younger than 41 years of age. ? You are older than 41 years of age and your health care provider tells you  that you are at risk for this type of infection. ? Your sexual activity has changed since you were last screened, and you are at increased risk for chlamydia or gonorrhea. Ask your health care provider if you are at risk.  Ask your health care provider about whether you are at high risk for HIV. Your health care provider may recommend a prescription medicine to help prevent HIV infection. If you choose to take medicine to prevent HIV, you should first get tested for HIV.  You should then be tested every 3 months for as long as you are taking the medicine. Pregnancy  If you are about to stop having your period (premenopausal) and you may become pregnant, seek counseling before you get pregnant.  Take 400 to 800 micrograms (mcg) of folic acid every day if you become pregnant.  Ask for birth control (contraception) if you want to prevent pregnancy. Osteoporosis and menopause Osteoporosis is a disease in which the bones lose minerals and strength with aging. This can result in bone fractures. If you are 69 years old or older, or if you are at risk for osteoporosis and fractures, ask your health care provider if you should:  Be screened for bone loss.  Take a calcium or vitamin D supplement to lower your risk of fractures.  Be given hormone replacement therapy (HRT) to treat symptoms of menopause. Follow these instructions at home: Lifestyle  Do not use any products that contain nicotine or tobacco, such as cigarettes, e-cigarettes, and chewing tobacco. If you need help quitting, ask your health care provider.  Do not use street drugs.  Do not share needles.  Ask your health care provider for help if you need support or information about quitting drugs. Alcohol use  Do not drink alcohol if: ? Your health care provider tells you not to drink. ? You are pregnant, may be pregnant, or are planning to become pregnant.  If you drink alcohol: ? Limit how much you use to 0-1 drink a day. ? Limit intake if you are breastfeeding.  Be aware of how much alcohol is in your drink. In the [place] one drink equals one 12 oz bottle of beer (355 mL), one 5 oz glass of wine (148 mL), or one 1 oz glass of hard liquor (44 mL). General instructions  Schedule regular health, dental, and eye exams.  Stay current with your vaccines.  Tell your health care provider if: ? You often feel depressed. ? You have ever been abused or do not feel safe at  home. Summary  Adopting a healthy lifestyle and getting preventive care are important in promoting health and wellness.  Follow your health care provider's instructions about healthy diet, exercising, and getting tested or screened for diseases.  Follow your health care provider's instructions on monitoring your cholesterol and blood pressure. This information is not intended to replace advice given to you by your health care provider. Make sure you discuss any questions you have with your health care provider. Document Revised: 10/14/2018 Document Reviewed: 10/14/2018 Elsevier Patient Education  2020 [employer].

## 2019-12-29 NOTE — Progress Notes (Signed)
   Subjective:   Patient ID: Janet Bowen, female    DOB: 09-Sep-1981, 39 y.o.   MRN: [id number]  HPI The patient is a 39 YO female coming in for physical.   PMH, [place], social history reviewed and updated  Review of Systems  Constitutional: Negative.   HENT: Negative.   Eyes: Negative.   Respiratory: Negative for cough, chest tightness and shortness of breath.   Cardiovascular: Negative for chest pain, palpitations and leg swelling.  Gastrointestinal: Negative for abdominal distention, abdominal pain, constipation, diarrhea, nausea and vomiting.  Musculoskeletal: Negative.   Skin: Negative.   Neurological: Negative.   Psychiatric/Behavioral: Negative.     Objective:  Physical Exam Constitutional:      Appearance: She is well-developed. She is obese.  HENT:     Head: Normocephalic and atraumatic.  Cardiovascular:     Rate and Rhythm: Normal rate and regular rhythm.  Pulmonary:     Effort: Pulmonary effort is normal. No respiratory distress.     Breath sounds: Normal breath sounds. No wheezing or rales.  Abdominal:     General: Bowel sounds are normal. There is no distension.     Palpations: Abdomen is soft.     Tenderness: There is no abdominal tenderness. There is no rebound.  Musculoskeletal:     Cervical back: Normal range of motion.  Skin:    General: Skin is warm and dry.  Neurological:     Mental Status: She is alert and oriented to person, place, and time.     Coordination: Coordination normal.     Vitals:   12/29/19 1049  BP: (!) 140/92  Pulse: 88  Temp: 98 F (36.7 C)  TempSrc: Oral  SpO2: 98%  Weight: 253 lb (114.8 kg)  Height: 5' 6.5" (1.689 m)    This visit occurred during the SARS-CoV-2 public health emergency.  Safety protocols were in place, including screening questions prior to the visit, additional usage of staff PPE, and extensive cleaning of exam room while observing appropriate contact time as indicated for disinfecting solutions.    Assessment & Plan:

## 2019-12-31 NOTE — Assessment & Plan Note (Signed)
Since she is not planning any more children she would like to go back to olmesartan/hctz. Needs BMP in 4-6 weeks and she will return for that.

## 2019-12-31 NOTE — Assessment & Plan Note (Signed)
We discussed metformin but she does not want to do that at this time.

## 2019-12-31 NOTE — Assessment & Plan Note (Signed)
Flu shot up to date. Tetanus up to date. Mammogram with gyn, pap smear up to date. Counseled about sun safety and mole surveillance. Counseled about the dangers of distracted driving. Given 10 year screening recommendations.

## 2020-02-02 ENCOUNTER — Other Ambulatory Visit (INDEPENDENT_AMBULATORY_CARE_PROVIDER_SITE_OTHER): Payer: 59

## 2020-02-02 ENCOUNTER — Telehealth: Payer: Self-pay | Admitting: Internal Medicine

## 2020-02-02 DIAGNOSIS — I10 Essential (primary) hypertension: Secondary | ICD-10-CM | POA: Diagnosis not present

## 2020-02-02 LAB — COMPREHENSIVE METABOLIC PANEL
ALT: 14 U/L (ref 0–35)
AST: 14 U/L (ref 0–37)
Albumin: 4 g/dL (ref 3.5–5.2)
Alkaline Phosphatase: 56 U/L (ref 39–117)
BUN: 12 mg/dL (ref 6–23)
CO2: 28 mEq/L (ref 19–32)
Calcium: 9 mg/dL (ref 8.4–10.5)
Chloride: 105 mEq/L (ref 96–112)
Creatinine, Ser: 0.72 mg/dL (ref 0.40–1.20)
GFR: 107.97 mL/min (ref >60.00)
Glucose, Bld: 80 mg/dL (ref 70–99)
Potassium: 3.7 mEq/L (ref 3.5–5.1)
Sodium: 138 mEq/L (ref 135–145)
Total Bilirubin: 0.3 mg/dL (ref 0.2–1.2)
Total Protein: 7 g/dL (ref 6.0–8.3)

## 2020-02-02 LAB — CBC
HCT: 38.3 % (ref 36.0–46.0)
Hemoglobin: 13 g/dL (ref 12.0–15.0)
MCHC: 34 g/dL (ref 30.0–36.0)
MCV: 85.7 fl (ref 78.0–100.0)
Platelets: 301 10*3/uL (ref 150.0–400.0)
RBC: 4.48 Mil/uL (ref 3.87–5.11)
RDW: 14 % (ref 11.5–15.5)
WBC: 5.4 10*3/uL (ref 4.0–10.5)

## 2020-02-02 NOTE — Telephone Encounter (Signed)
Patient's BP medication was changed at her 2/24 appointment. She does not like how she feels on this new medication.  She would like to switch back to her old BP medication  Labetlol 200 mg twice a day.

## 2020-02-09 NOTE — Telephone Encounter (Signed)
What are BP readings on new medication? How is she feeling on medication?

## 2020-02-11 ENCOUNTER — Ambulatory Visit: Payer: 59 | Attending: Internal Medicine

## 2020-02-11 DIAGNOSIS — Z23 Encounter for immunization: Secondary | ICD-10-CM

## 2020-02-11 MED ORDER — LABETALOL HCL 200 MG PO TABS: 200.0000 mg | ORAL_TABLET | Freq: Two times a day (BID) | ORAL | 3 refills | Status: DC

## 2020-02-11 NOTE — Telephone Encounter (Signed)
Sent in refill labetalol.

## 2020-02-11 NOTE — Progress Notes (Signed)
   Covid-19 Vaccination Clinic  Name:  Janet Bowen    MRN: [id number] DOB: November 16, 1980  02/11/2020  Ms. [name] was observed post Covid-19 immunization for 15 minutes without incident. She was provided with Vaccine Information Sheet and instruction to access the V-Safe system.   Ms. [name] was instructed to call 911 with any severe reactions post vaccine: Marland Kitchen Difficulty breathing  . Swelling of face and throat  . A fast heartbeat  . A bad rash all over body  . Dizziness and weakness   Immunizations Administered    Name Date Dose VIS Date Route   Pfizer COVID-19 Vaccine 02/11/2020  3:40 PM 0.3 mL 10/15/2019 Intramuscular   Manufacturer: [employer], [employer]   Lot: [id number]   [place]: [id number]

## 2020-02-11 NOTE — Telephone Encounter (Signed)
Pt states the medication was causing her to have a feeling of increased anxiety. She states she did not get any BP readings and quit taking it and restarted her labetalol from her old RX

## 2020-02-11 NOTE — Telephone Encounter (Signed)
Pt.notified

## 2020-02-11 NOTE — Addendum Note (Signed)
Addended by: Pricilla Holm A on: 02/11/2020 04:16 PM   Modules accepted: Orders

## 2020-03-08 ENCOUNTER — Ambulatory Visit: Payer: 59 | Attending: Internal Medicine

## 2020-03-08 DIAGNOSIS — Z23 Encounter for immunization: Secondary | ICD-10-CM

## 2020-03-08 NOTE — Progress Notes (Signed)
   Covid-19 Vaccination Clinic  Name:  OHANNA CATE    MRN: [id number] DOB: July 10, 1981  03/08/2020  Ms. Savely was observed post Covid-19 immunization for 15 minutes without incident. She was provided with Vaccine Information Sheet and instruction to access the V-Safe system.   Ms. Ricotta was instructed to call 911 with any severe reactions post vaccine: Marland Kitchen Difficulty breathing  . Swelling of face and throat  . A fast heartbeat  . A bad rash all over body  . Dizziness and weakness   Immunizations Administered    Name Date Dose VIS Date Route   Pfizer COVID-19 Vaccine 03/08/2020  9:10 AM 0.3 mL 12/29/2018 Intramuscular   Manufacturer: [place]   Lot: [id number]   [place]: [id number]      Covid-19 Vaccination Clinic  Name:  JENAYE GALLIHER    MRN: [id number] DOB: July 25, 1981  03/08/2020  Ms. Jersey was observed post Covid-19 immunization for 15 minutes without incident. She was provided with Vaccine Information Sheet and instruction to access the V-Safe system.   Ms. Shave was instructed to call 911 with any severe reactions post vaccine: Marland Kitchen Difficulty breathing  . Swelling of face and throat  . A fast heartbeat  . A bad rash all over body  . Dizziness and weakness   Immunizations Administered    Name Date Dose VIS Date Route   Pfizer COVID-19 Vaccine 03/08/2020  9:10 AM 0.3 mL 12/29/2018 Intramuscular   Manufacturer: [place]   Lot: [id number]   [place]: [id number]

## 2020-06-14 ENCOUNTER — Encounter (HOSPITAL_COMMUNITY): Payer: Self-pay | Admitting: Emergency Medicine

## 2020-06-14 ENCOUNTER — Ambulatory Visit (HOSPITAL_COMMUNITY)
Admission: EM | Admit: 2020-06-14 | Discharge: 2020-06-14 | Disposition: A | Discharge status: Home / Self Care | Payer: 59 | Attending: Internal Medicine | Admitting: Internal Medicine

## 2020-06-14 ENCOUNTER — Other Ambulatory Visit: Payer: Self-pay

## 2020-06-14 DIAGNOSIS — Z20822 Contact with and (suspected) exposure to covid-19: Secondary | ICD-10-CM | POA: Insufficient documentation

## 2020-06-14 NOTE — ED Triage Notes (Signed)
tient presents to [facility] for assessment after she states she was around her friend Saturday and that friend tested positive for COVID.  Denies any symptoms at this time.

## 2020-06-15 LAB — SARS CORONAVIRUS 2 (TAT 6-24 HRS): SARS Coronavirus 2: NEGATIVE

## 2020-10-04 ENCOUNTER — Encounter: Payer: Self-pay | Admitting: Internal Medicine

## 2020-10-04 ENCOUNTER — Ambulatory Visit (INDEPENDENT_AMBULATORY_CARE_PROVIDER_SITE_OTHER): Payer: 59 | Admitting: Internal Medicine

## 2020-10-04 ENCOUNTER — Other Ambulatory Visit: Payer: Self-pay

## 2020-10-04 VITALS — BP 160/90 | HR 82 | Temp 98.9°F | Ht 66.5 in | Wt 247.8 lb

## 2020-10-04 DIAGNOSIS — I10 Essential (primary) hypertension: Secondary | ICD-10-CM | POA: Diagnosis not present

## 2020-10-04 DIAGNOSIS — R002 Palpitations: Secondary | ICD-10-CM | POA: Diagnosis not present

## 2020-10-04 DIAGNOSIS — Z23 Encounter for immunization: Secondary | ICD-10-CM | POA: Diagnosis not present

## 2020-10-04 LAB — COMPREHENSIVE METABOLIC PANEL
ALT: 15 U/L (ref 0–35)
AST: 15 U/L (ref 0–37)
Albumin: 4 g/dL (ref 3.5–5.2)
Alkaline Phosphatase: 64 U/L (ref 39–117)
BUN: 12 mg/dL (ref 6–23)
CO2: 27 mEq/L (ref 19–32)
Calcium: 9.1 mg/dL (ref 8.4–10.5)
Chloride: 105 mEq/L (ref 96–112)
Creatinine, Ser: 0.76 mg/dL (ref 0.40–1.20)
GFR: 97.1 mL/min (ref >60.00)
Glucose, Bld: 108 mg/dL — ABNORMAL HIGH (ref 70–99)
Potassium: 3.9 mEq/L (ref 3.5–5.1)
Sodium: 138 mEq/L (ref 135–145)
Total Bilirubin: 0.3 mg/dL (ref 0.2–1.2)
Total Protein: 7.4 g/dL (ref 6.0–8.3)

## 2020-10-04 LAB — CBC
HCT: 40.5 % (ref 36.0–46.0)
Hemoglobin: 13.5 g/dL (ref 12.0–15.0)
MCHC: 33.4 g/dL (ref 30.0–36.0)
MCV: 86 fl (ref 78.0–100.0)
Platelets: 288 10*3/uL (ref 150.0–400.0)
RBC: 4.71 Mil/uL (ref 3.87–5.11)
RDW: 13.2 % (ref 11.5–15.5)
WBC: 5.8 10*3/uL (ref 4.0–10.5)

## 2020-10-04 LAB — TSH: TSH: 0.59 u[IU]/mL (ref 0.35–4.50)

## 2020-10-04 LAB — VITAMIN B12: Vitamin B-12: 789 pg/mL (ref 211–911)

## 2020-10-04 LAB — HEMOGLOBIN A1C: Hgb A1c MFr Bld: 6.1 % (ref 4.6–6.5)

## 2020-10-04 LAB — VITAMIN D 25 HYDROXY (VIT D DEFICIENCY, FRACTURES): VITD: 27.64 ng/mL — ABNORMAL LOW (ref 30.00–100.00)

## 2020-10-04 MED ORDER — LABETALOL HCL 300 MG PO TABS: 300.0000 mg | ORAL_TABLET | Freq: Two times a day (BID) | ORAL | 1 refills | Status: DC

## 2020-10-04 NOTE — Patient Instructions (Signed)
We are checking the labs today and will get back in touch with you on results.   The EKG did show more early beats called PVCs than last time which is likely causing the palpitations you are having.  We are going to increase the labetalol to 300 mg twice a day and have sent in the new prescription.   If we do not find an explanation on the labs we will likely order a monitor to wear at home for a week or two to see how many early beats.

## 2020-10-04 NOTE — Assessment & Plan Note (Signed)
EKG done in office with new PVCs consistent with bigeminy. Checking CMP, CBC, HgA1c, thyroid, vitamin B12 and D for cause of new PVCs. If no cause needs monitoring to assess PVC burden and if high needs referral to cardiology. Will increase labetalol to 300 mg BID.

## 2020-10-04 NOTE — Progress Notes (Signed)
Subjective:   Patient ID: Janet Bowen, female    DOB: 1981/05/29, 39 y.o.   MRN: [id number]  HPI The patient is a 39 YO female coming in for concerns about blood pressure and palpitations. Started in the last 2 months or so. Has had palpitations and rising BP to 150s/90s at home consistently. She denies change in diet. Not exercising much lately. Denies change in caffeine intake and drinks mostly decaf coffee. No energy drinks or increase in chocolate intake. No drug usage. Did try herbal supplement to help with potential anxiety. When she gets palpitations she gets anxiety with it and more often at her job. She is able to use breathing techniques to calm down but this does not last long. Happening daily. Denies fevers or chills. Weight down about 5 pounds since Feb and she has been making dietary changes and eating less sodium. Still taking labetalol 200 mg BID. We did try switch back to benicar/hctz 20/12.5 mg daily but she did not tolerate that change back in the spring and we did resume the labetalol 200 mg BID.   PMH, [facility], social history reviewed and updated  Review of Systems  Constitutional: Negative.   HENT: Negative.   Eyes: Negative.   Respiratory: Negative for cough, chest tightness and shortness of breath.   Cardiovascular: Positive for palpitations. Negative for chest pain and leg swelling.  Gastrointestinal: Negative for abdominal distention, abdominal pain, constipation, diarrhea, nausea and vomiting.  Musculoskeletal: Negative.   Skin: Negative.   Neurological: Negative.   Psychiatric/Behavioral: The patient is nervous/anxious.     Objective:  Physical Exam Constitutional:      Appearance: She is well-developed. She is obese.  HENT:     Head: Normocephalic and atraumatic.  Cardiovascular:     Rate and Rhythm: Normal rate and regular rhythm.  Pulmonary:     Effort: Pulmonary effort is normal. No respiratory distress.     Breath sounds: Normal breath sounds. No  wheezing or rales.  Abdominal:     General: Bowel sounds are normal. There is no distension.     Palpations: Abdomen is soft.     Tenderness: There is no abdominal tenderness. There is no rebound.  Musculoskeletal:     Cervical back: Normal range of motion.  Skin:    General: Skin is warm and dry.  Neurological:     Mental Status: She is alert and oriented to person, place, and time.     Coordination: Coordination normal.     Vitals:   10/04/20 0848  BP: (!) 160/90  Pulse: 82  Temp: 98.9 F (37.2 C)  TempSrc: Oral  SpO2: 98%  Weight: 247 lb 12.8 oz (112.4 kg)  Height: 5' 6.5" (1.689 m)   EKG: Rate 77, axis normal, interval normal, frequent PVCs, sinus, no st or t wave changes, PVCs are new compared to 2020  This visit occurred during the SARS-CoV-2 public health emergency.  Safety protocols were in place, including screening questions prior to the visit, additional usage of staff PPE, and extensive cleaning of exam room while observing appropriate contact time as indicated for disinfecting solutions.   Assessment & Plan:  Flu shot given at visit  Visit time 25 minutes in face to face communication with patient and coordination of care, additional 5 minutes spent in record review, coordination or care, ordering tests, communicating/referring to other healthcare professionals, documenting in medical records all on the same day of the visit for total time 30 minutes spent on the  visit.

## 2020-10-04 NOTE — Assessment & Plan Note (Signed)
Increase labetalol to 300 mg BID to help with high BP readings and also PVCs new.

## 2020-10-05 ENCOUNTER — Other Ambulatory Visit: Payer: Self-pay | Admitting: Internal Medicine

## 2020-10-05 ENCOUNTER — Telehealth: Payer: Self-pay | Admitting: Radiology

## 2020-10-05 DIAGNOSIS — R002 Palpitations: Secondary | ICD-10-CM

## 2020-10-05 NOTE — Telephone Encounter (Signed)
Enrolled patient for a 14 day Zio XT monitor to be mailed to patients home. Brief instructions were gone over with the patient and she knows to expect the monitor to arrive in 3-4 days.

## 2020-10-10 ENCOUNTER — Ambulatory Visit (INDEPENDENT_AMBULATORY_CARE_PROVIDER_SITE_OTHER): Payer: 59

## 2020-10-10 DIAGNOSIS — R002 Palpitations: Secondary | ICD-10-CM

## 2020-12-28 ENCOUNTER — Encounter: Payer: Self-pay | Admitting: Internal Medicine

## 2020-12-28 ENCOUNTER — Ambulatory Visit (INDEPENDENT_AMBULATORY_CARE_PROVIDER_SITE_OTHER): Payer: 59 | Admitting: Internal Medicine

## 2020-12-28 ENCOUNTER — Other Ambulatory Visit: Payer: Self-pay

## 2020-12-28 VITALS — BP 126/66 | HR 80 | Temp 98.5°F | Resp 18 | Ht 66.5 in | Wt 244.4 lb

## 2020-12-28 DIAGNOSIS — E6609 Other obesity due to excess calories: Secondary | ICD-10-CM

## 2020-12-28 DIAGNOSIS — R7303 Prediabetes: Secondary | ICD-10-CM | POA: Diagnosis not present

## 2020-12-28 DIAGNOSIS — Z0000 Encounter for general adult medical examination without abnormal findings: Secondary | ICD-10-CM | POA: Diagnosis not present

## 2020-12-28 DIAGNOSIS — I10 Essential (primary) hypertension: Secondary | ICD-10-CM

## 2020-12-28 DIAGNOSIS — Z6838 Body mass index (BMI) 38.0-38.9, adult: Secondary | ICD-10-CM

## 2020-12-28 LAB — LIPID PANEL
Cholesterol: 136 mg/dL (ref 0–200)
HDL: 35.7 mg/dL — ABNORMAL LOW (ref >39.00)
LDL Cholesterol: 86 mg/dL (ref 0–99)
NonHDL: 99.92
Total CHOL/HDL Ratio: 4
Triglycerides: 69 mg/dL (ref 0.0–149.0)
VLDL: 13.8 mg/dL (ref 0.0–40.0)

## 2020-12-28 MED ORDER — LABETALOL HCL 300 MG PO TABS: 300.0000 mg | ORAL_TABLET | Freq: Two times a day (BID) | ORAL | 3 refills | Status: DC

## 2020-12-28 NOTE — Patient Instructions (Signed)
Health Maintenance, Female Adopting a healthy lifestyle and getting preventive care are important in promoting health and wellness. Ask your health care provider about:  The right schedule for you to have regular tests and exams.  Things you can do on your own to prevent diseases and keep yourself healthy. What should I know about diet, weight, and exercise? Eat a healthy diet  Eat a diet that includes plenty of vegetables, fruits, low-fat dairy products, and lean protein.  Do not eat a lot of foods that are high in solid fats, added sugars, or sodium.   Maintain a healthy weight Body mass index (BMI) is used to identify weight problems. It estimates body fat based on height and weight. Your health care provider can help determine your BMI and help you achieve or maintain a healthy weight. Get regular exercise Get regular exercise. This is one of the most important things you can do for your health. Most adults should:  Exercise for at least 150 minutes each week. The exercise should increase your heart rate and make you sweat (moderate-intensity exercise).  Do strengthening exercises at least twice a week. This is in addition to the moderate-intensity exercise.  Spend less time sitting. Even light physical activity can be beneficial. Watch cholesterol and blood lipids Have your blood tested for lipids and cholesterol at 42 years of age, then have this test every 5 years. Have your cholesterol levels checked more often if:  Your lipid or cholesterol levels are high.  You are older than 42 years of age.  You are at high risk for heart disease. What should I know about cancer screening? Depending on your health history and family history, you may need to have cancer screening at various ages. This may include screening for:  Breast cancer.  Cervical cancer.  Colorectal cancer.  Skin cancer.  Lung cancer. What should I know about heart disease, diabetes, and high blood  pressure? Blood pressure and heart disease  High blood pressure causes heart disease and increases the risk of stroke. This is more likely to develop in people who have high blood pressure readings, are of African descent, or are overweight.  Have your blood pressure checked: ? Every 3-5 years if you are 18-39 years of age. ? Every year if you are 40 years old or older. Diabetes Have regular diabetes screenings. This checks your fasting blood sugar level. Have the screening done:  Once every three years after age 40 if you are at a normal weight and have a low risk for diabetes.  More often and at a younger age if you are overweight or have a high risk for diabetes. What should I know about preventing infection? Hepatitis B If you have a higher risk for hepatitis B, you should be screened for this virus. Talk with your health care provider to find out if you are at risk for hepatitis B infection. Hepatitis C Testing is recommended for:  Everyone born from 1945 through 1965.  Anyone with known risk factors for hepatitis C. Sexually transmitted infections (STIs)  Get screened for STIs, including gonorrhea and chlamydia, if: ? You are sexually active and are younger than 42 years of age. ? You are older than 42 years of age and your health care provider tells you that you are at risk for this type of infection. ? Your sexual activity has changed since you were last screened, and you are at increased risk for chlamydia or gonorrhea. Ask your health care provider   if you are at risk.  Ask your health care provider about whether you are at high risk for HIV. Your health care provider may recommend a prescription medicine to help prevent HIV infection. If you choose to take medicine to prevent HIV, you should first get tested for HIV. You should then be tested every 3 months for as long as you are taking the medicine. Pregnancy  If you are about to stop having your period (premenopausal) and  you may become pregnant, seek counseling before you get pregnant.  Take 400 to 800 micrograms (mcg) of folic acid every day if you become pregnant.  Ask for birth control (contraception) if you want to prevent pregnancy. Osteoporosis and menopause Osteoporosis is a disease in which the bones lose minerals and strength with aging. This can result in bone fractures. If you are 65 years old or older, or if you are at risk for osteoporosis and fractures, ask your health care provider if you should:  Be screened for bone loss.  Take a calcium or vitamin D supplement to lower your risk of fractures.  Be given hormone replacement therapy (HRT) to treat symptoms of menopause. Follow these instructions at home: Lifestyle  Do not use any products that contain nicotine or tobacco, such as cigarettes, e-cigarettes, and chewing tobacco. If you need help quitting, ask your health care provider.  Do not use street drugs.  Do not share needles.  Ask your health care provider for help if you need support or information about quitting drugs. Alcohol use  Do not drink alcohol if: ? Your health care provider tells you not to drink. ? You are pregnant, may be pregnant, or are planning to become pregnant.  If you drink alcohol: ? Limit how much you use to 0-1 drink a day. ? Limit intake if you are breastfeeding.  Be aware of how much alcohol is in your drink. In the [place] one drink equals one 12 oz bottle of beer (355 mL), one 5 oz glass of wine (148 mL), or one 1 oz glass of hard liquor (44 mL). General instructions  Schedule regular health, dental, and eye exams.  Stay current with your vaccines.  Tell your health care provider if: ? You often feel depressed. ? You have ever been abused or do not feel safe at home. Summary  Adopting a healthy lifestyle and getting preventive care are important in promoting health and wellness.  Follow your health care provider's instructions about healthy  diet, exercising, and getting tested or screened for diseases.  Follow your health care provider's instructions on monitoring your cholesterol and blood pressure. This information is not intended to replace advice given to you by your health care provider. Make sure you discuss any questions you have with your health care provider. Document Revised: 10/14/2018 Document Reviewed: 10/14/2018 Elsevier Patient Education  2021 [employer]vier Inc.  

## 2020-12-28 NOTE — Assessment & Plan Note (Signed)
Recent HgA1c 6.1 and working on weight loss. Will monitor yearly.

## 2020-12-28 NOTE — Assessment & Plan Note (Signed)
BP at goal on labetalol 300 mg BID. Refilled today. HR okay. Recent labs normal.

## 2020-12-28 NOTE — Progress Notes (Signed)
   Subjective:   Patient ID: Janet Bowen, female    DOB: 07/18/1981, 40 y.o.   MRN: [id number]  HPI The patient is a 40 YO female coming in for physical.   PMH, [place], social history reviewed and updated  Review of Systems  Constitutional: Negative.   HENT: Negative.   Eyes: Negative.   Respiratory: Negative for cough, chest tightness and shortness of breath.   Cardiovascular: Negative for chest pain, palpitations and leg swelling.  Gastrointestinal: Negative for abdominal distention, abdominal pain, constipation, diarrhea, nausea and vomiting.  Musculoskeletal: Negative.   Skin: Negative.   Neurological: Negative.   Psychiatric/Behavioral: Negative.     Objective:  Physical Exam Constitutional:      Appearance: She is well-developed and well-nourished.  HENT:     Head: Normocephalic and atraumatic.  Eyes:     Extraocular Movements: EOM normal.  Cardiovascular:     Rate and Rhythm: Normal rate and regular rhythm.  Pulmonary:     Effort: Pulmonary effort is normal. No respiratory distress.     Breath sounds: Normal breath sounds. No wheezing or rales.  Abdominal:     General: Bowel sounds are normal. There is no distension.     Palpations: Abdomen is soft.     Tenderness: There is no abdominal tenderness. There is no rebound.  Musculoskeletal:        General: No edema.     Cervical back: Normal range of motion.  Skin:    General: Skin is warm and dry.  Neurological:     Mental Status: She is alert and oriented to person, place, and time.     Coordination: Coordination normal.  Psychiatric:        Mood and Affect: Mood and affect normal.     Vitals:   12/28/20 0953  BP: 126/66  Pulse: 80  Resp: 18  Temp: 98.5 F (36.9 C)  TempSrc: Oral  SpO2: 97%  Weight: 244 lb 6.4 oz (110.9 kg)  Height: 5' 6.5" (1.689 m)   This visit occurred during the SARS-CoV-2 public health emergency.  Safety protocols were in place, including screening questions prior to the  visit, additional usage of staff PPE, and extensive cleaning of exam room while observing appropriate contact time as indicated for disinfecting solutions.   Assessment & Plan:

## 2020-12-28 NOTE — Assessment & Plan Note (Signed)
Weight is going down with diet and reinforced those changes.

## 2020-12-28 NOTE — Assessment & Plan Note (Signed)
Flu shot up to date. Covid-19 up to date including booster. Tetanus up to date. Mammogram up to date with gyn, pap smear up to date with gyn. Counseled about sun safety and mole surveillance. Counseled about the dangers of distracted driving. Given 10 year screening recommendations.

## 2021-04-22 IMAGING — US US MFM OB TRANSVAGINAL
1 series · 12 of 24 positions shown · non-contrast
Comparison: none

[Series 1: us mfm ob transvaginal · 24 acquisitions, 12 frames shown]
[im 2/24]
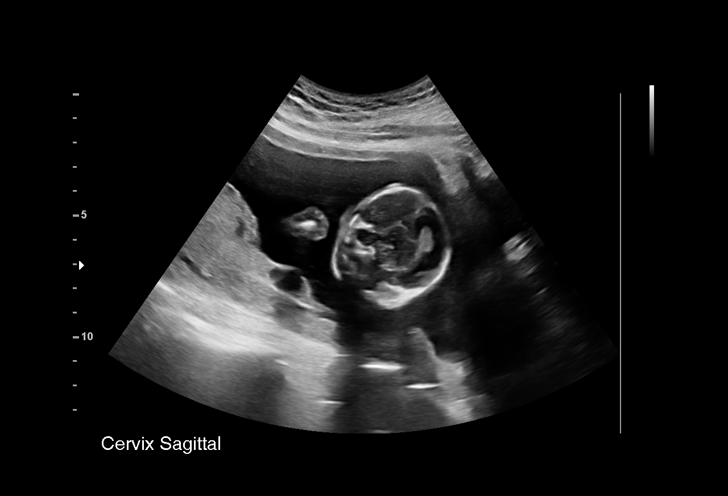
[im 4/24]
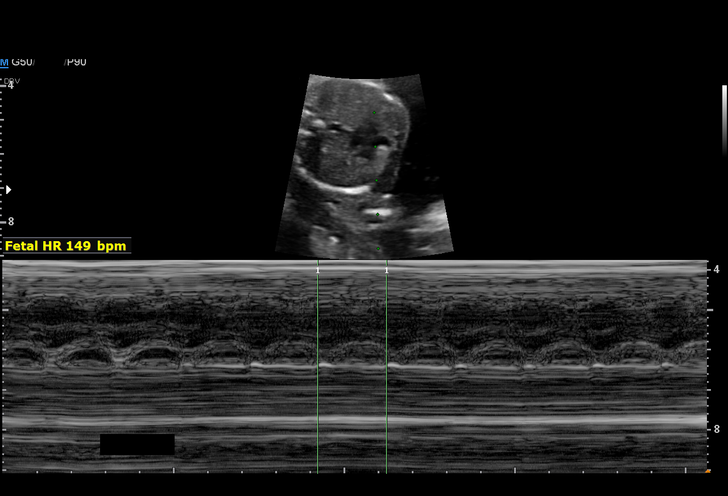
[im 6/24]
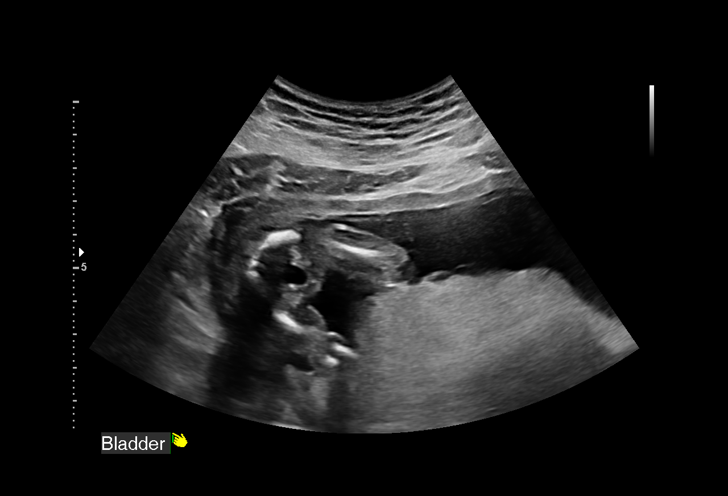
[im 8/24]
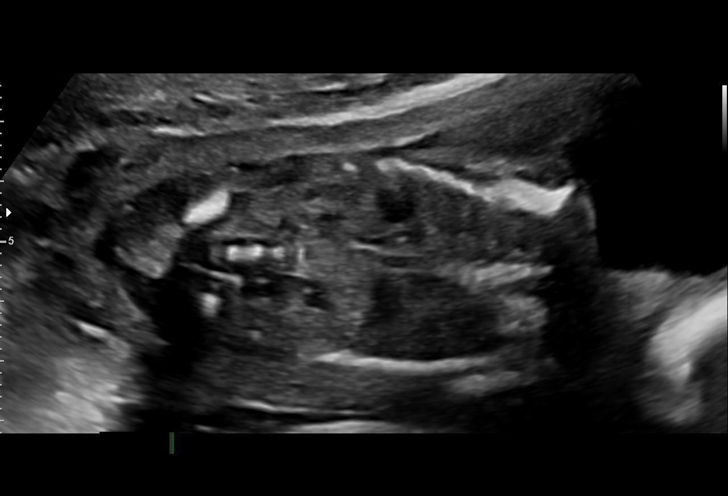
[im 10/24]
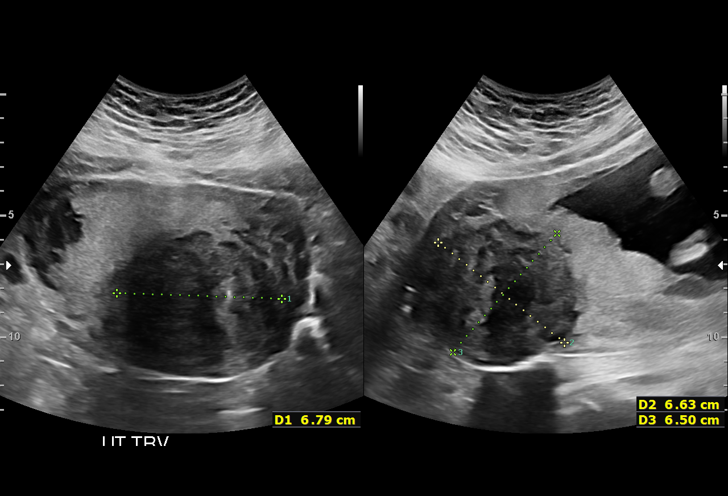
[im 12/24]
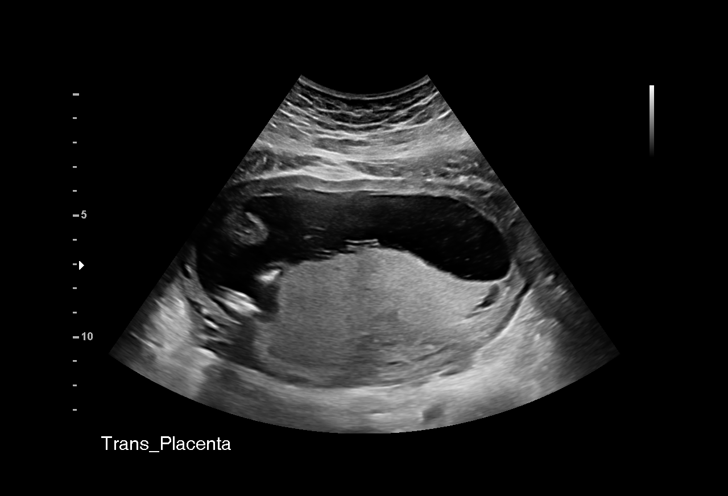
[im 14/24]
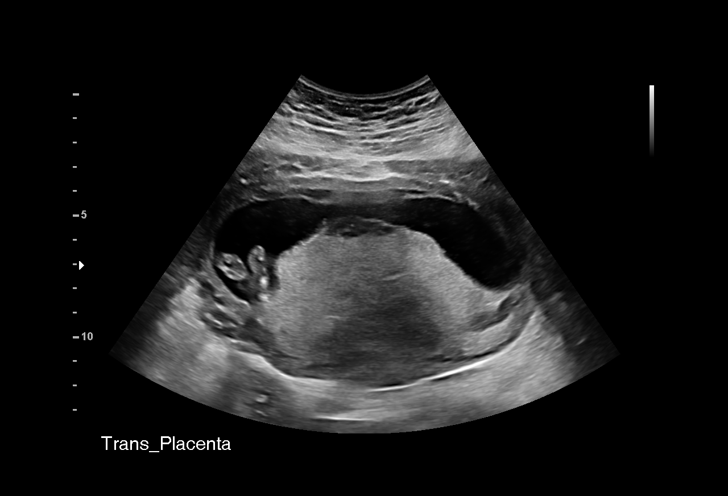
[im 16/24]
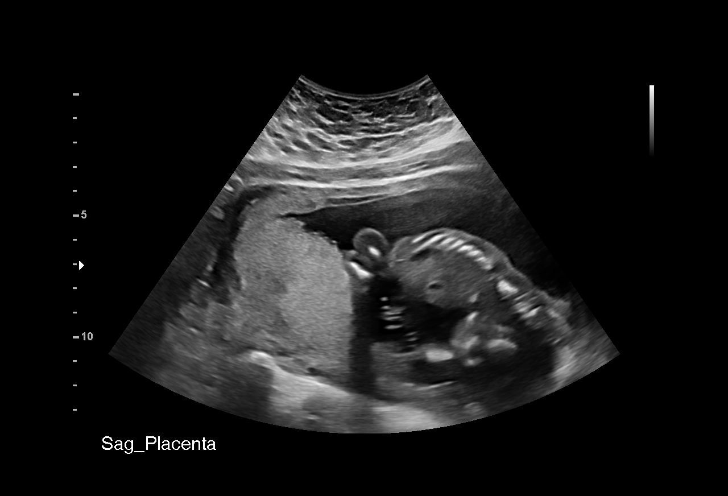
[im 18/24]
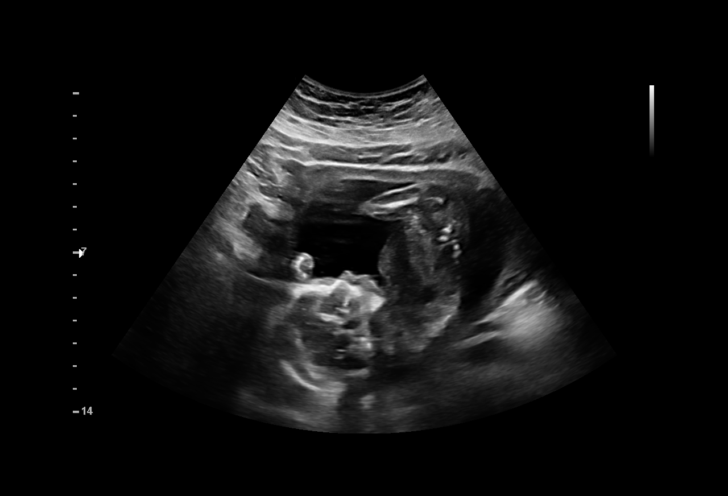
[im 20/24]
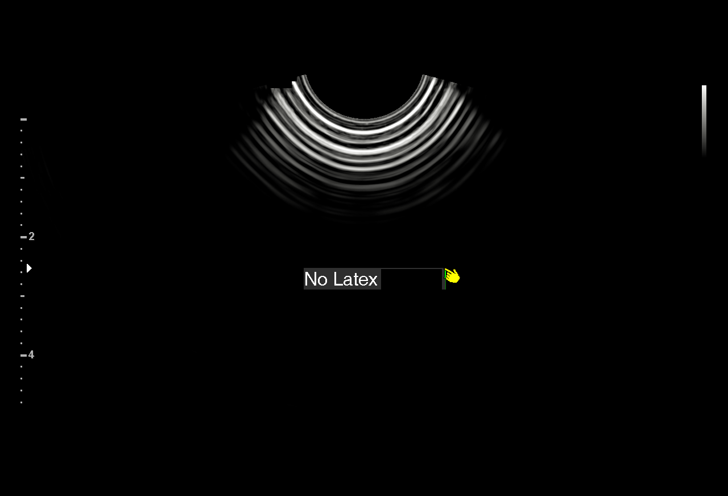
[im 22/24]
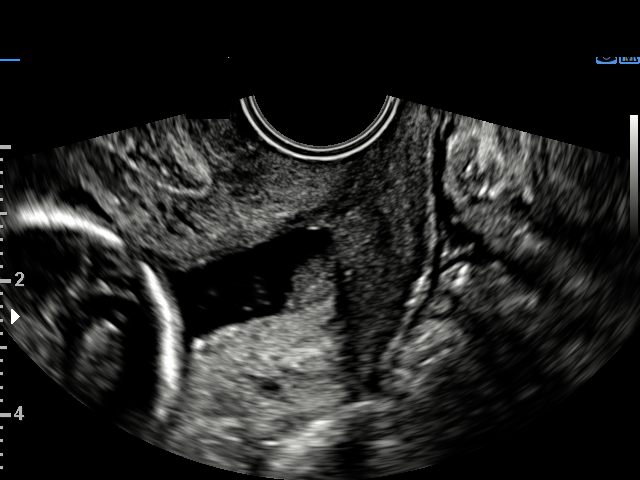
[im 24/24]
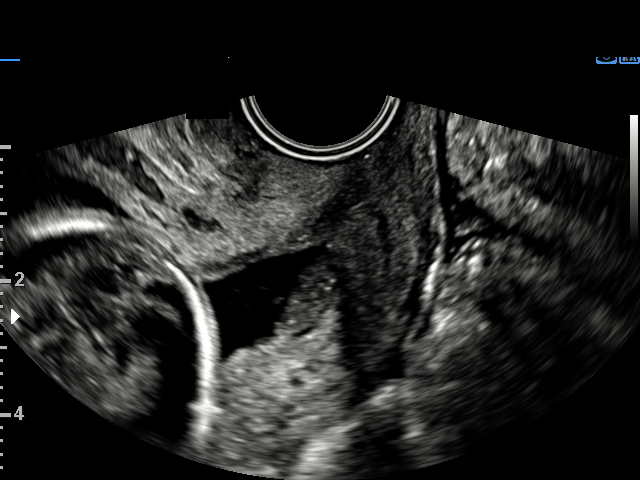

[12 of 24 positions shown; findings below may reference images not displayed]

[facility]
                                                            [facility]

  1  US MFM OB TRANSVAGINAL               76817.2      NAREN
                                                       PM
 ----------------------------------------------------------------------

 ----------------------------------------------------------------------
Indications

  Cervical shortening complicating pregnancy
  18 weeks gestation of pregnancy
  Obesity complicating pregnancy, second
  trimester (Pre Pregnancy BMI 37.95)
  Gestational diabetes in pregnancy, diet
  controlled
  Hypertension - Chronic/Pre-existing
  (labatalol)
  Advanced maternal age primigravida 35+,
  second trimester
  Uterine fibroids
 ----------------------------------------------------------------------
Vital Signs

 BMI:
Fetal Evaluation

 Num Of Fetuses:         1
 Fetal Heart Rate(bpm):  149
 Cardiac Activity:       Observed
 Presentation:           Cephalic
 Placenta:               Posterior

 Amniotic Fluid
 AFI FV:      Within normal limits
                             Largest Pocket(cm)

OB History

 Gravidity:    1
Gestational Age

 LMP:           18w 0d        Date:  12/28/18                 EDD:   10/04/19
 Best:          18w 0d     Det. By:  LMP  (12/28/18)          EDD:   10/04/19
Cervix Uterus Adnexa

 Cervix
 Length:            0.8  cm.
 Funneling of internal os noted. Measured transvaginally.

 Uterus
 Single fibroid noted, see table below.
Myomas

  Site                     L(cm)      W(cm)      D(cm)      Location
  Fundus
 ----------------------------------------------------------------------

  Blood Flow                 RI        PI       Comments

 ----------------------------------------------------------------------
Impression

 A limited ultrasound study was performed. Amniotic fluid is
 normal and good fetal activity is seen. An intramural myoma,
 measuring 6.6 x 6.8 x 6.5 cm, is seen in the fundal region.
 Patient does not have symptoms pertaining to the myoma.
 On transabdominal scan, cervical canal was dilated
 proximally (funneling).

 We performed transvaginal ultrasound to evaluate the cervix.
 Funneling was seen. The residual-closed portion of the cervix
 measures 8 millimeters. No further shortening was seen on
 transfundal pressure.

 After explaining, I performed sterile-speculum examination.
 Cervix and vagina appear health. External os is closed.
 Digital examination showed the cervix to be about 1.5 cm
 long and external os is closed.

 BP at our office: 120/83 mm Hg.
 xxxxxxxxxxxxxxxxxxxxxxxxxxxxxxxxxxxxxxxxxxxxxxxxxxxxxxxxx
 xxxxxxxxxxxx
 Consultation Note Copied from [REDACTED]

 Maternal-Fetal Medicine

 Name: Lc Graber
 MRN: [id number]
 Requesting Provider: Pavia, Mayumi

 ZALANDAUSKAS had the pleasure of [facility] She is G1 P0 at 18-weeks' gestation.
 She is here for ultrasound and consultation.
 On your office ultrasound performed on 04/23/19, the cervix
 measured 1.8 to 1.9 cm on transvaginal ultrasound. Patient
 has been taking vaginal progesterone since then. She does
 not have symptoms of pelvic pressure or vaginal bleeding.
 Past surgical history is significant for cold-knife cone biopsy
 procedure performed 8 years ago for abnormal cervical
 cytology. This is her first pregnancy.
 PMH: Chronic hypertension for 10 years and the patient is
 taking labetalol 100 mg bid and her blood pressures are
 reportedly well controlled.
 Patient also has gestational diabetes and is checking her
 blood glucose regularly. She reports her fasting levels are
 between 80 and 95 mg/Dl and her postprandial (2-h) levels
 are below 130 mg/dL. Diabetes is well-controlled on diet. She
 had a consultation with her endocrinologist.
 Of note, glucola was performed a few hours after she took
 first dose of prednisone that was prescribed for Bell's palsy.
 She does not have sickle-cell trait or any other chronic
 medical conditions.
 Medications: Prenatal vitamins, aspirin 81 mg daily, vaginal
 progesterone, labetalol.
 Allergies: NKDA.
 Social: Denies tobacco or drug or alcohol use.  She is an ex-
 smoker. Her partner is African American and he is in good
 health.
 Family: No history of venous thromboembolism in the family.

 Prenatal course: On cell-free fetal DNA screening, the risk of
 fetal aneuploidies are not increased.

 I counseled the patient on the following:

 Cervical incompetence:
 Patient has cervical incompetence. I explained the findings
 with help of ultrasound images and diagrams. Her past
 surgical history of cone biopsy is a high-risk factor for cervical
 incompetence. While prophylactic cerclage is not indicated in
 women with history of cold-knife conization, failure of
 progesterone treatment or significant shortening of cervix
 (under 10 millimeters) is a strong indication for rescue
 cerclage.
 I recommended rescue cerclage. I explained the procedure
 and possible complications including miscarriage, infection,
 bleeding and injuries to bladder or bowel (all rare). I also
 informed her that cerclage does not guarantee carrying
 pregnancy to term and the chances of preterm delivery is still
 high.

 After counseling, the patient opted to have rescue cerclage.

 Chronic hypertension:
 I discussed the importance of taking antihypertensives mainly
 to prevent maternal complications including stroke.
 Superimposed preeclampsia occurs in about 30% of cases.
 Other complications include fetal growth restriction, placental
 abruption and increased likelihood of cesarean delivery.
 I discussed the benefit of low-dose aspirin prophylaxis in
 delaying or preventing preeclampsia.

 Gestational diabetes:
 We briefly discussed gestational diabetes and management.
 Ultrasound protocol was also discussed. Patient seems very
 motivated and checks her blood glucose regularly.

 I discussed the findings and recommendations with Dr.
 Dorsey.
Recommendations

 -Rescue cerclage tomorrow. Dr. Meleta Bishope make OR
 arrangements and her office will call the patient.
 -Follow-up cervical length measurement at our office next
 week.
 -Detailed fetal anatomical survey at 20 weeks.
 -Serial fetal growth assessments every 4 weeks.
 -Weekly antenatal testing from 32 to 34 weeks till delivery.
 -Cerclage removal at 37 weeks.
 -Delivery at 39 weeks.
 -Continue follow-up with endocrinologist as needed.
 -LbBH7 estimation is useful in identifying type 2 diabetes
 (greater than 6.5%). Fetal echocardiography if 6bQ6c is
 greater than 6.5%.
                 Coca, Karime

## 2021-04-30 IMAGING — US US MFM OB TRANSVAGINAL
1 series · 14 of 25 positions shown · non-contrast
Comparison: none

[Series 1: us mfm ob transvaginal · 25 acquisitions, 14 frames shown]
[im 1/25]
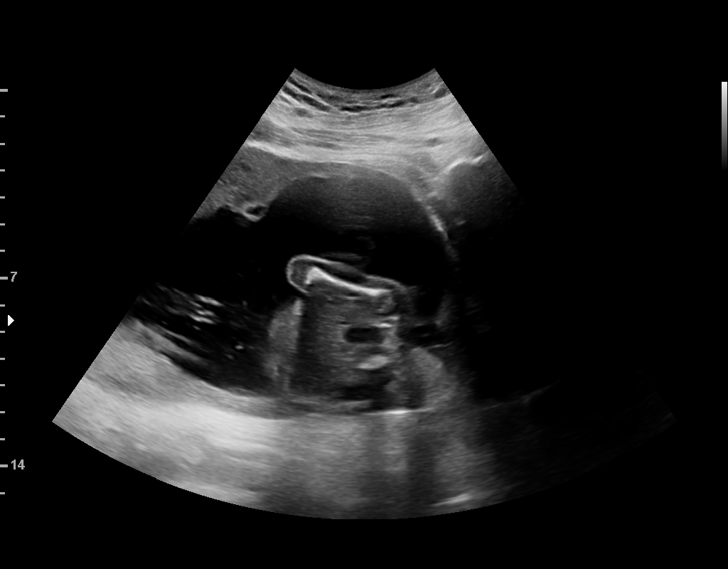
[im 3/25]
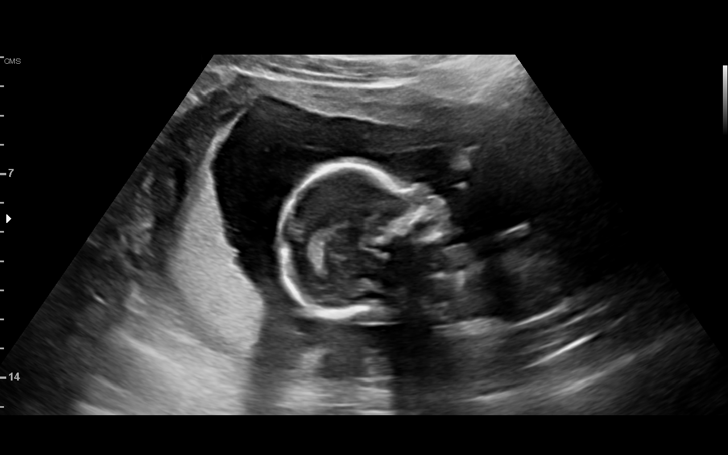
[im 5/25]
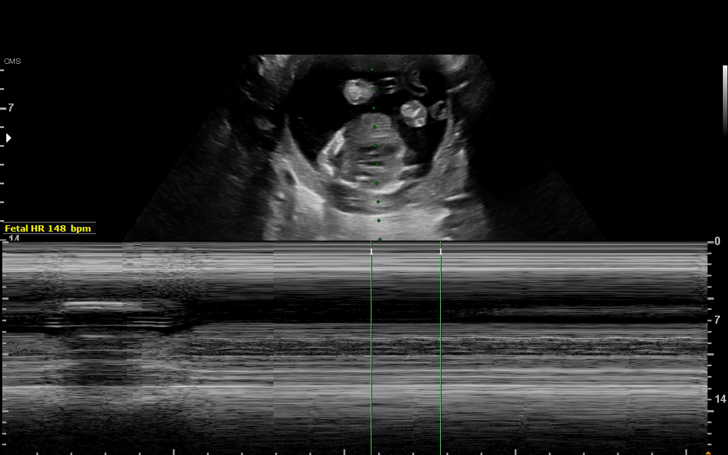
[im 7/25]
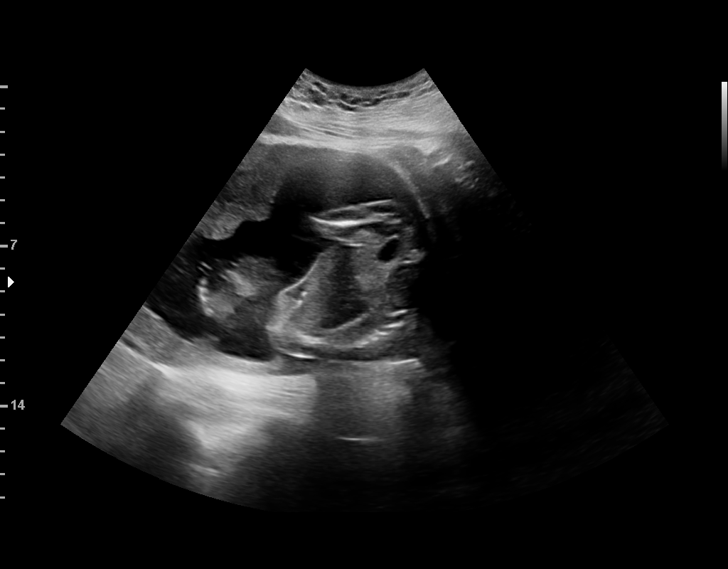
[im 9/25]
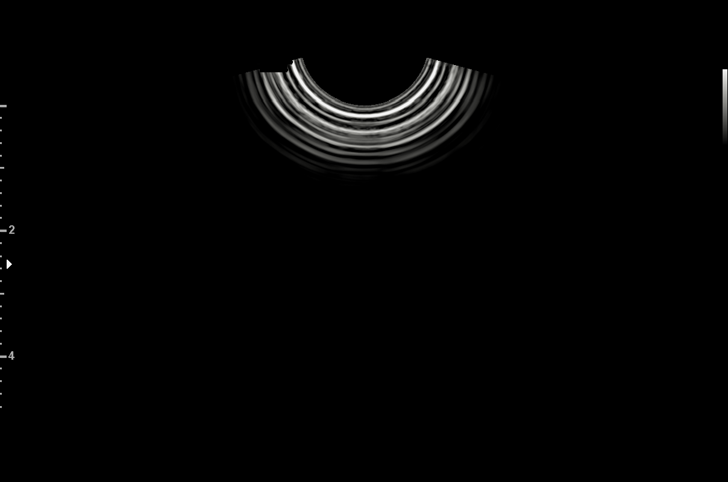
[im 10/25]
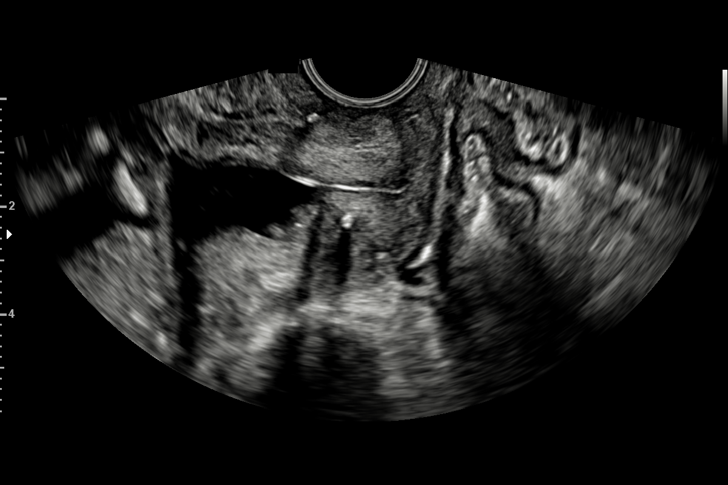
[im 12/25]
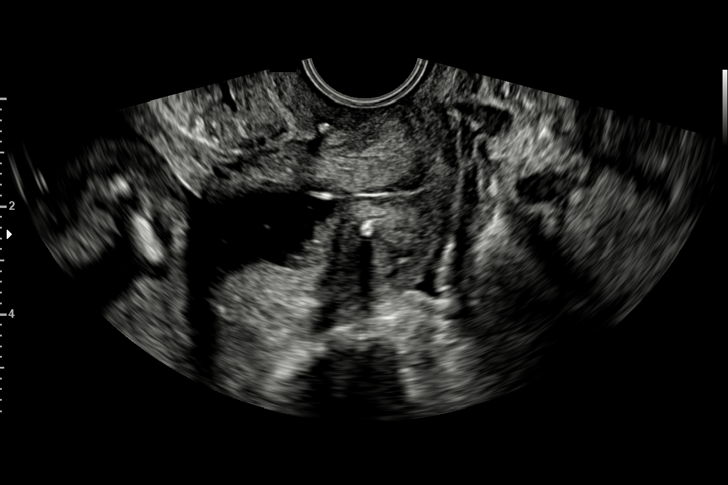
[im 14/25]
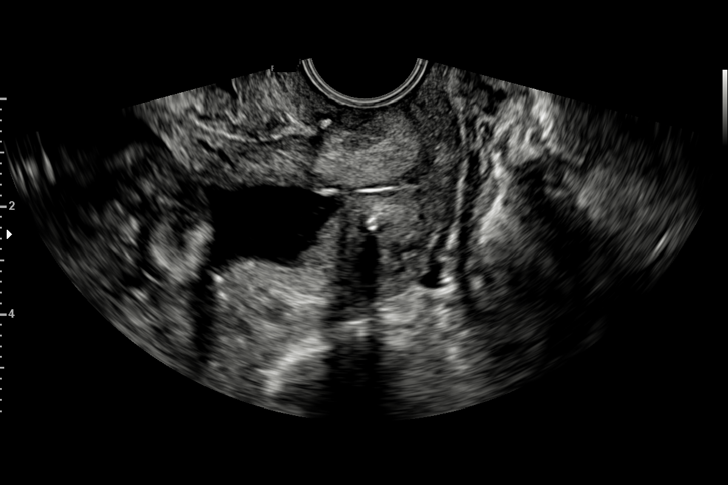
[im 16/25]
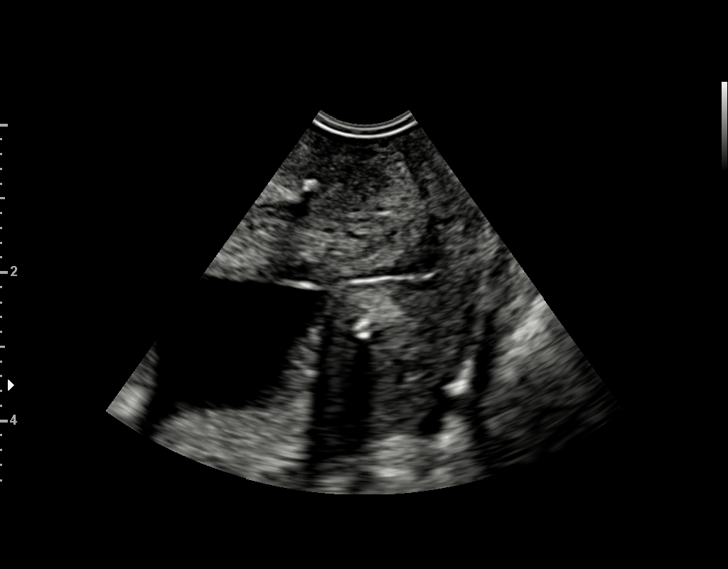
[im 17/25]
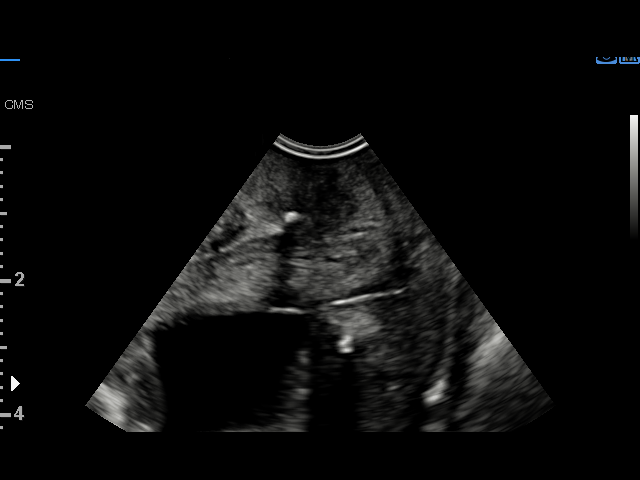
[im 19/25]
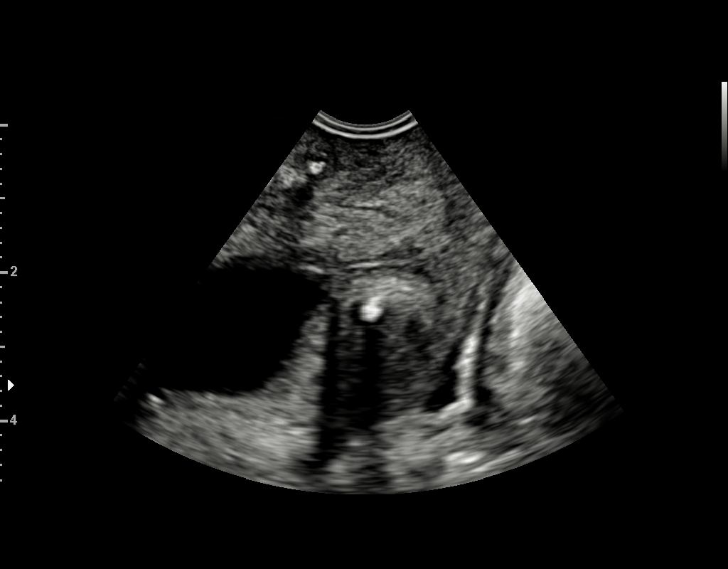
[im 21/25]
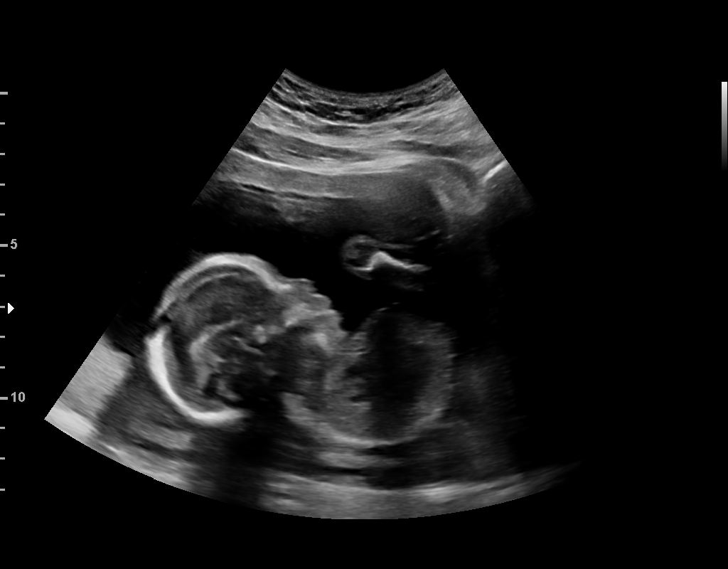
[im 23/25]
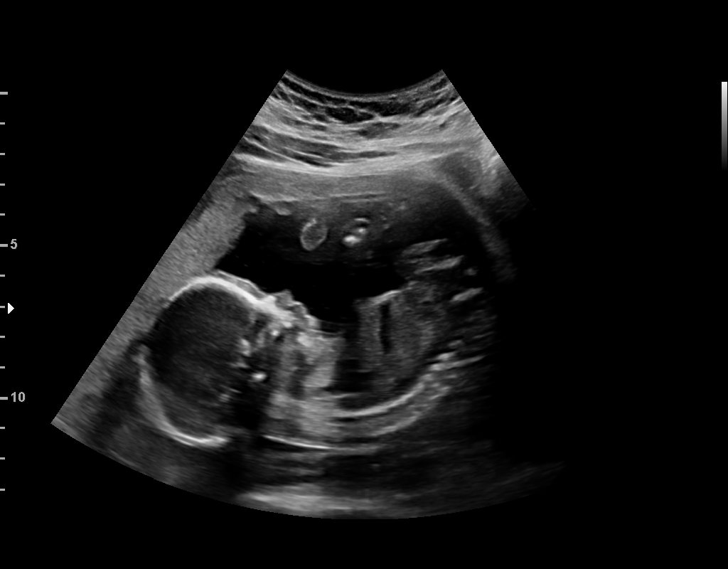
[im 25/25]
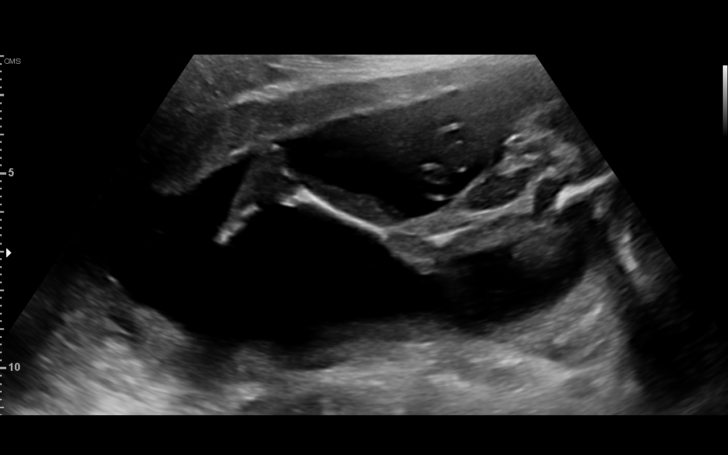

[14 of 25 positions shown; findings below may reference images not displayed]

[facility]
                                                            [facility]

 ----------------------------------------------------------------------

 ----------------------------------------------------------------------
Indications

  19 weeks gestation of pregnancy
  Cervical shortening complicating pregnancy
  (cerclage 05/04/19)
  Obesity complicating pregnancy, second
  trimester (Pre Pregnancy BMI 37.95)
  Gestational diabetes in pregnancy, diet
  controlled
  Hypertension - Chronic/Pre-existing
  (labatalol)
  Advanced maternal age primigravida 35+,
  second trimester
  Uterine fibroids
 ----------------------------------------------------------------------
Vital Signs

                                                Height:        5'6"
Fetal Evaluation

 Num Of Fetuses:         1
 Fetal Heart Rate(bpm):  148
 Cardiac Activity:       Observed
 Presentation:           Breech

 Amniotic Fluid
 AFI FV:      Within normal limits
OB History
 Gravidity:    1
Gestational Age

 LMP:           19w 1d        Date:  12/28/18                 EDD:   10/04/19
 Best:          19w 1d     Det. By:  LMP  (12/28/18)          EDD:   10/04/19
Cervix Uterus Adnexa

 Cervix
 Length:            1.6  cm.
 Measured transvaginally. Cerclage visualized.
Impression

 S/p cerclage on 05/04/19. Patient does not have symptoms of
 pelvic pressure or vaginal bleeding.
 Amniotic fluid is normal and good fetal activity is seen.
 We performed transvaginal ultrasound to evaluate the cervix.
 The closed-portion of cervix measures 1.6 cm (1.4 distal to
 the cerclage). Funneling is seen proximal to the cerclage.

 We reassured the patient of the findings.

 Patient reports she has fetal anatomy scan scheduled at your
 office. She understands that cerclage does not guarantee
 carrying pregnancy to term.
Recommendations

 An appointment was made for her to return in 4 weeks for
 cervical length assessment.
                 Toll, Durga

## 2021-06-17 IMAGING — US US MFM OB LIMITED
1 series · 15 of 28 positions shown · non-contrast
Comparison: none

[Series 1: us mfm ob limited · 15 of 39 slices shown]
[im 1/39]
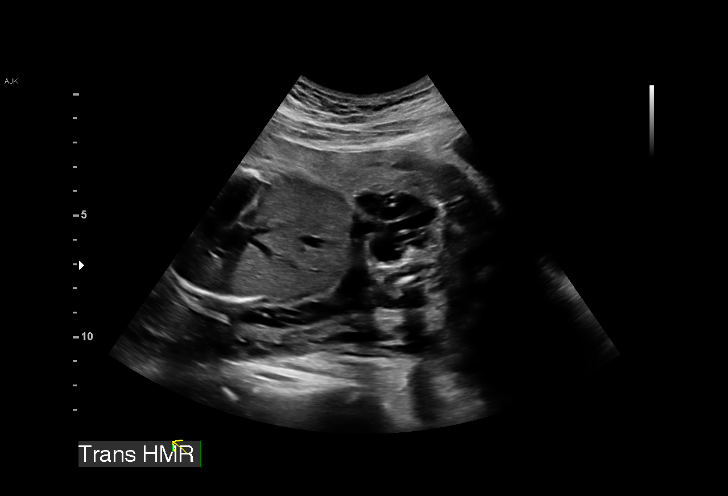
[im 3/39]
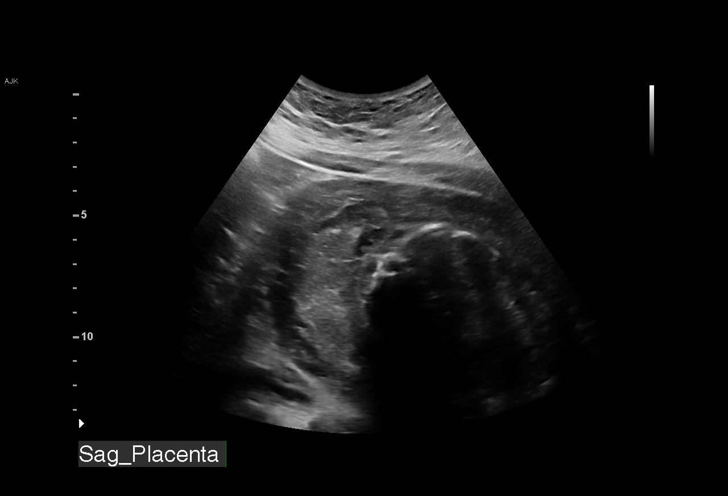
[im 6/39]
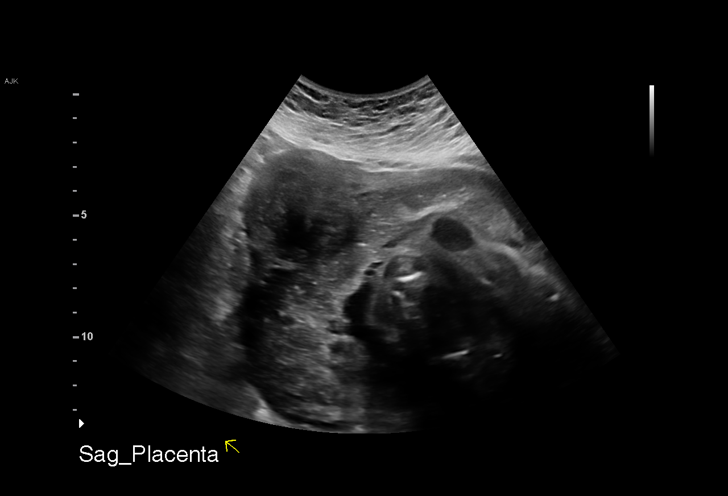
[im 9/39]
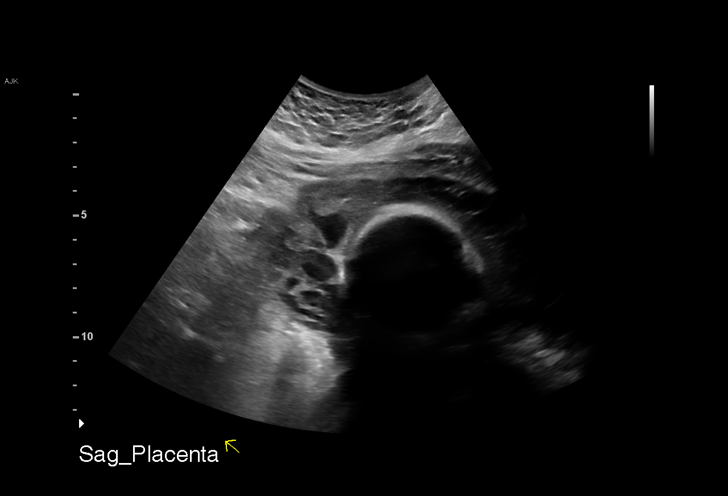
[im 12/39]
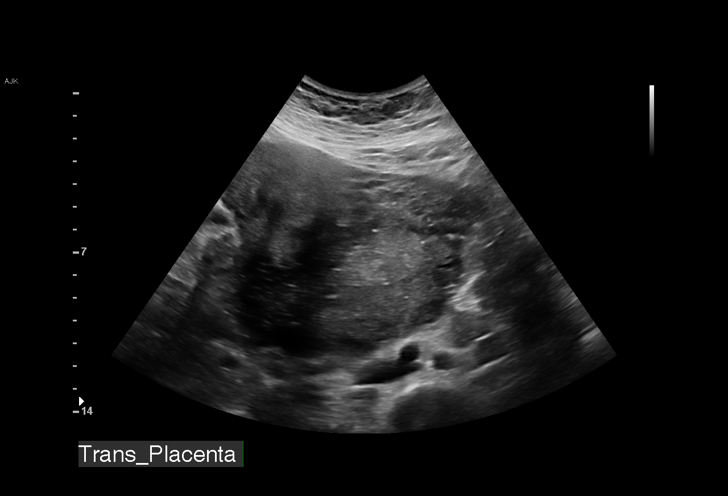
[im 15/39]
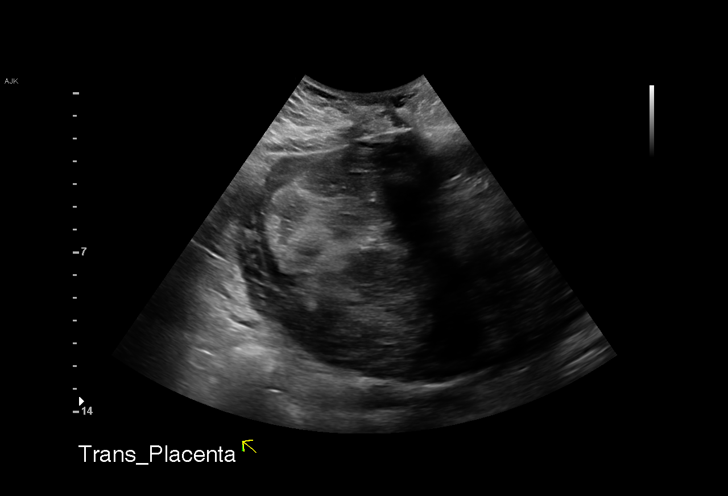
[im 17/39]
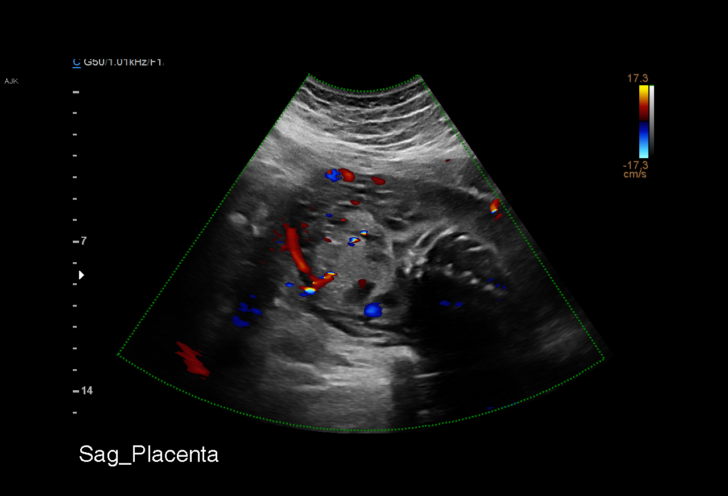
[im 20/39]
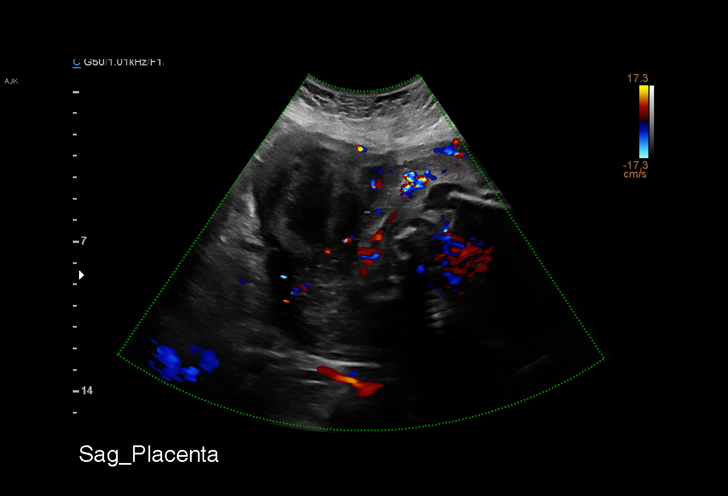
[im 22/39]
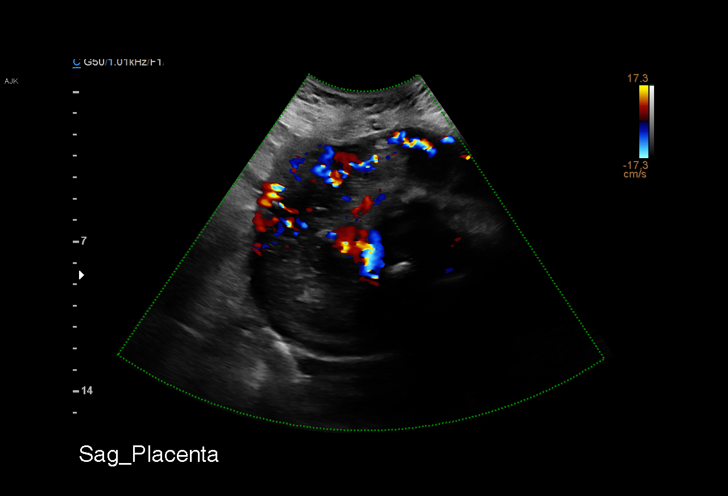
[im 24/39]
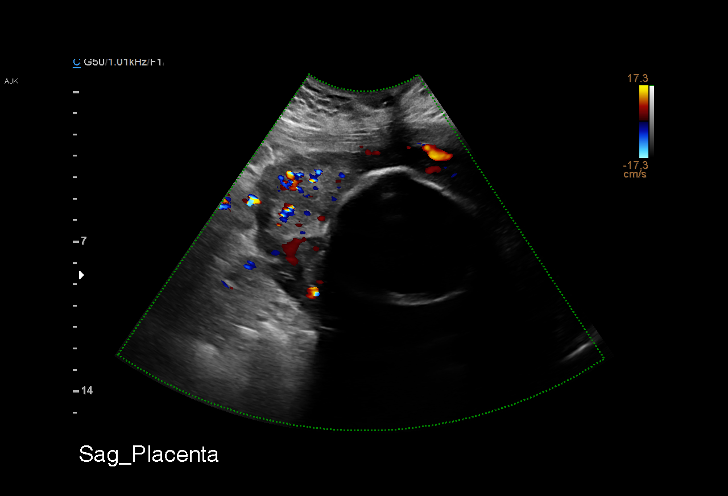
[im 27/39]
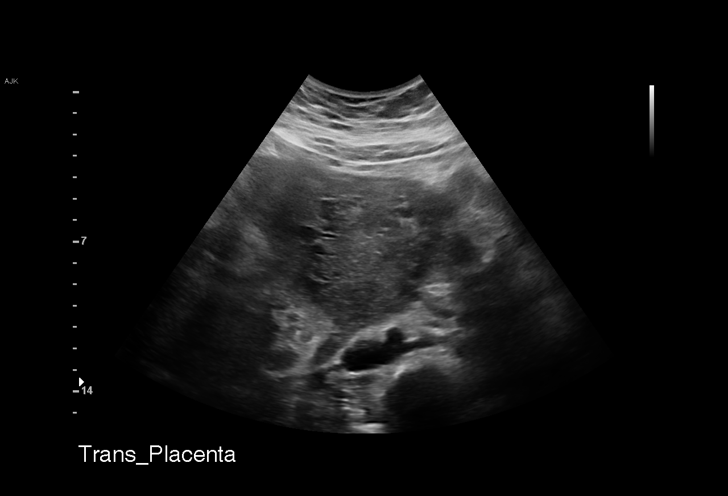
[im 30/39]
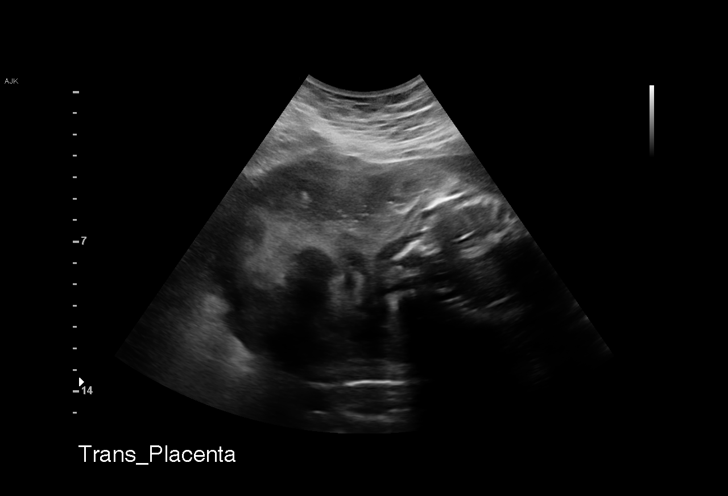
[im 33/39]
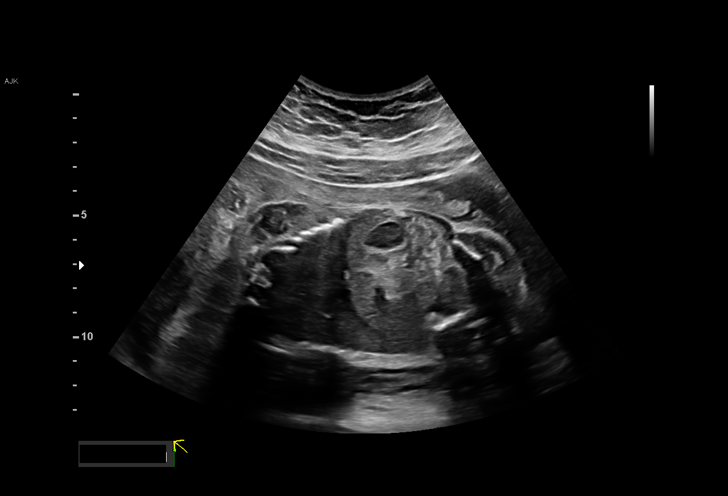
[im 36/39]
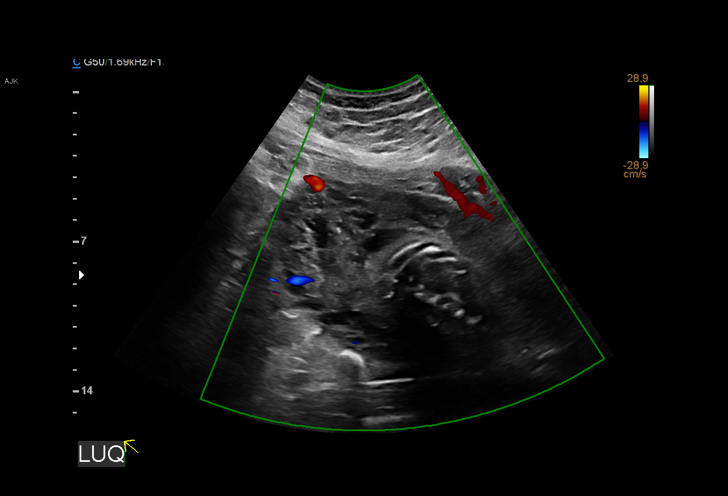
[im 39/39]
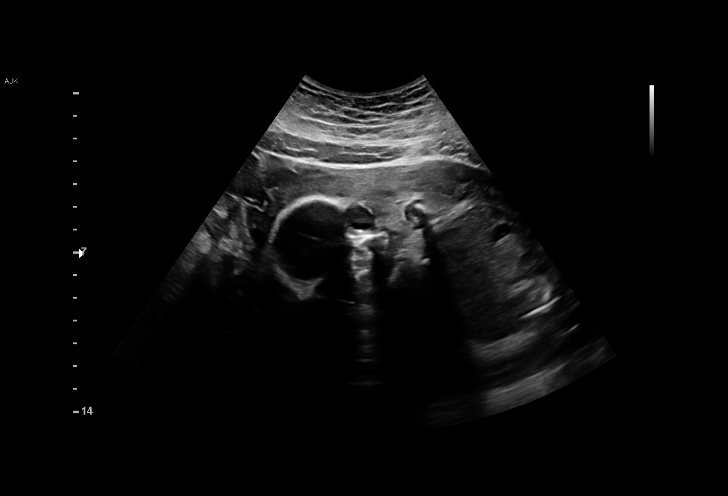

[15 of 28 positions shown; findings below may reference images not displayed]

[facility]
                                                             [facility]

  1  US MFM OB LIMITED                    76815.01     LORRAINE
                                                       MARI-KAISA
 ----------------------------------------------------------------------

 ----------------------------------------------------------------------
Indications

  26 weeks gestation of pregnancy
  Cervical shortening complicating pregnancy
  (cerclage 05/04/19)
  Obesity complicating pregnancy, second
  trimester (Pre Pregnancy BMI 37.95)
  Gestational diabetes in pregnancy, diet
  controlled
  Hypertension - Chronic/Pre-existing
  (labatalol)
  Advanced maternal age primigravida 35+,
  second trimester( 38 yrs)
  Uterine fibroids
  Low Risk NIPS
  Premature rupture of membranes - leaking
  fluid
 ----------------------------------------------------------------------
Vital Signs

                                                Height:        5'6"
Fetal Evaluation

 Num Of Fetuses:          1
 Fetal Heart Rate(bpm):   138
 Cardiac Activity:        Observed
 Presentation:            Transverse, head to maternal right
 Placenta:                Posterior
 P. Cord Insertion:       Visualized
 Amniotic Fluid
 AFI FV:      Anhydramnios

 Comment:    No placental abruption or previa identified. stomach and bladder
             noted.
OB History

 Gravidity:    1
Gestational Age

 LMP:           26w 0d        Date:  12/28/18                 EDD:   10/04/19
 Best:          26w 0d     Det. By:  LMP  (12/28/18)          EDD:   10/04/19
Cervix Uterus Adnexa

 Cervix
 Not adaquately visualized

 Uterus
 fundal fibroid noted.

 Left Ovary
 Not visualized.

 Right Ovary
 Not visualized.

 Adnexa
 No abnormality visualized.
Impression

 Anhydramnios
 PPPROM
 CHTN
 AMA 38 yo
 LJCKN
Recommendations

 Management per inpatient

## 2021-06-30 IMAGING — US US MFM OB LIMITED
1 series · 14 of 15 positions shown · non-contrast
Comparison: none

[Series 1: us mfm ob limited · 14 of 15 slices shown]
[im 1/15]
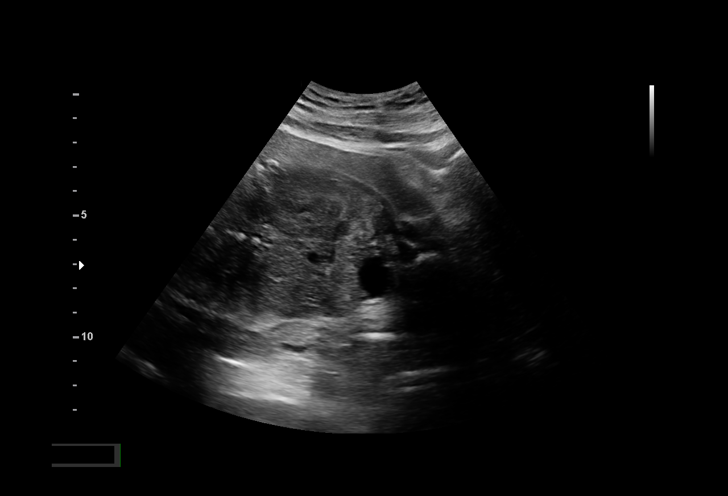
[im 2/15]
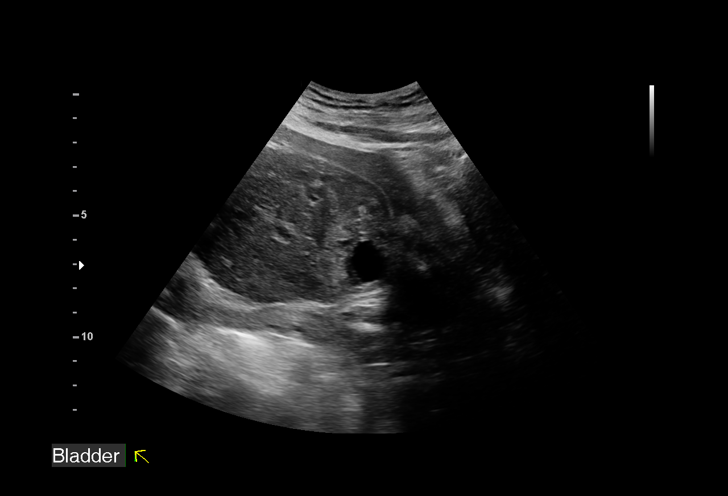
[im 3/15]
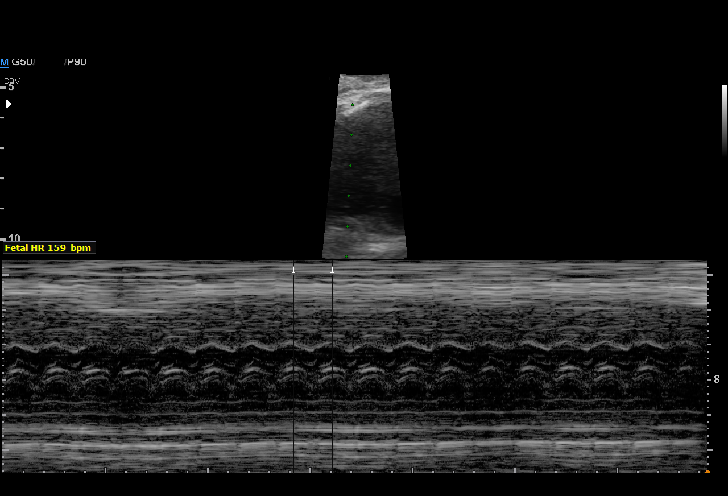
[im 4/15]
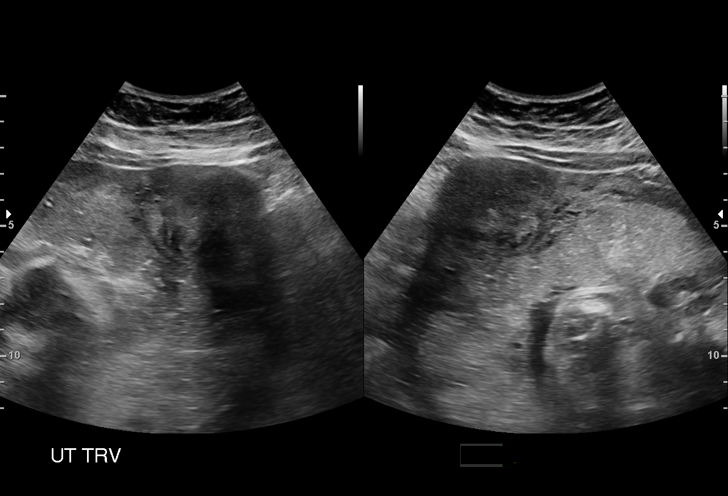
[im 5/15]
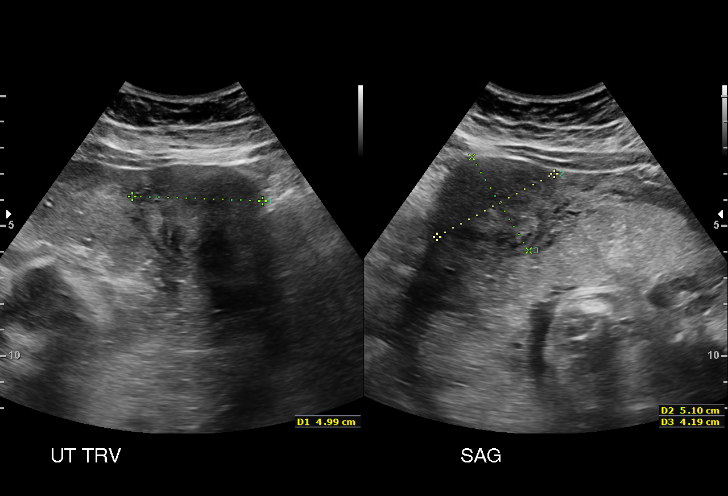
[im 6/15]
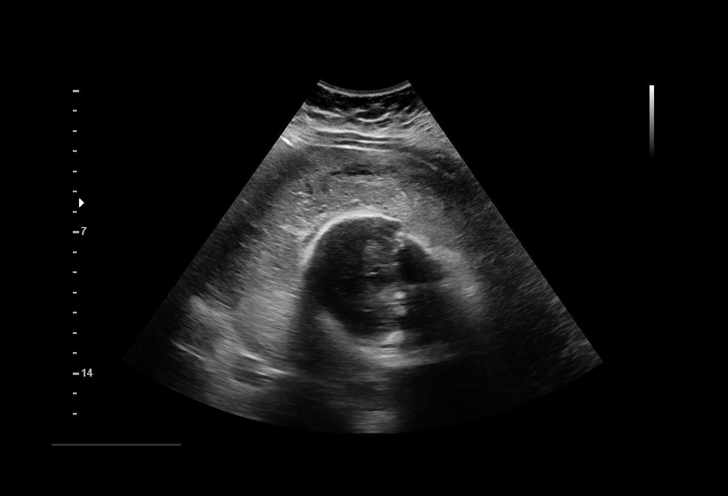
[im 7/15]
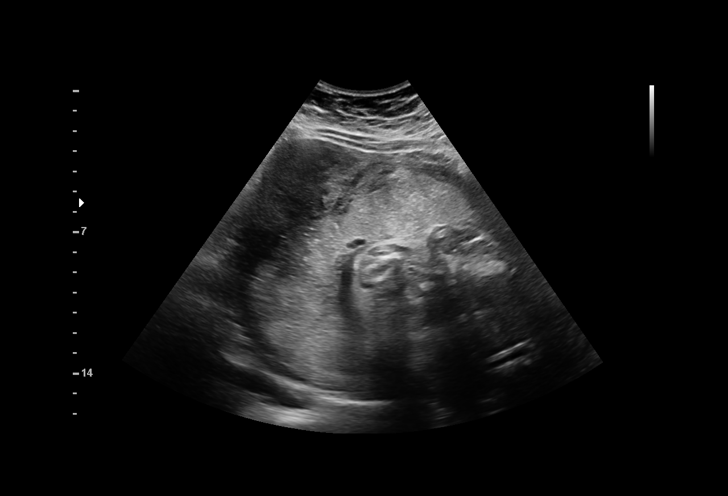
[im 9/15]
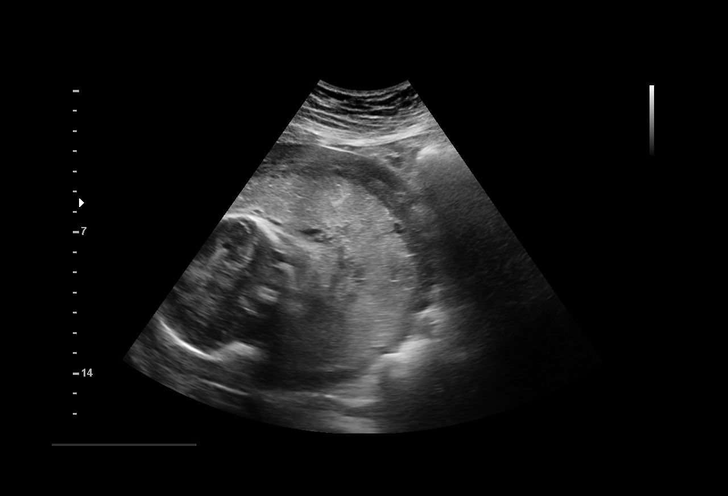
[im 10/15]
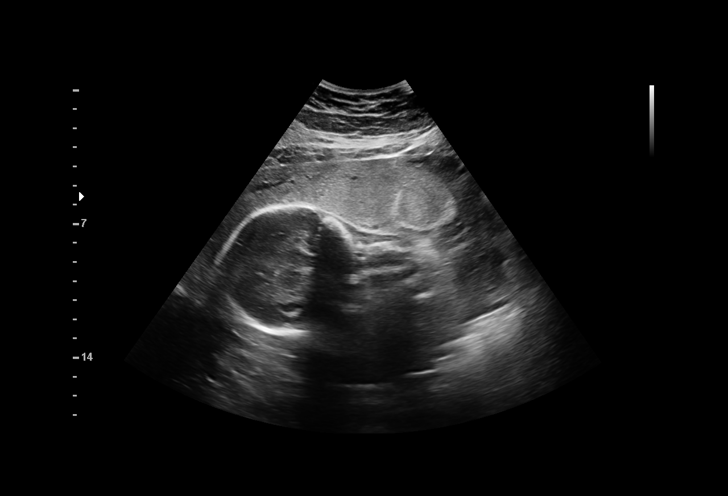
[im 11/15]
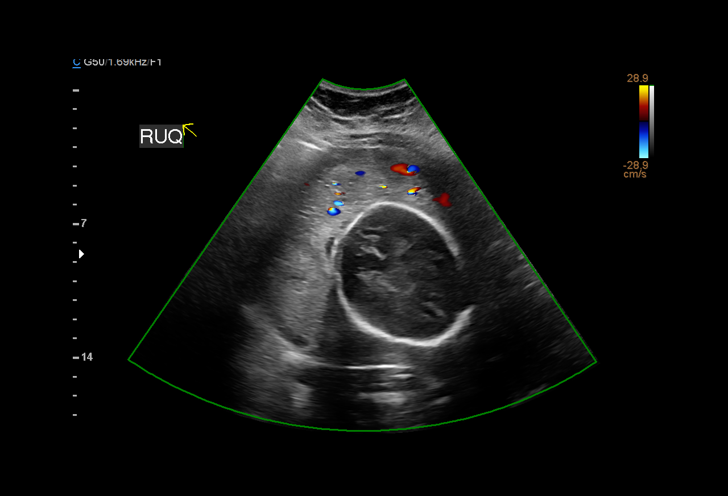
[im 12/15]
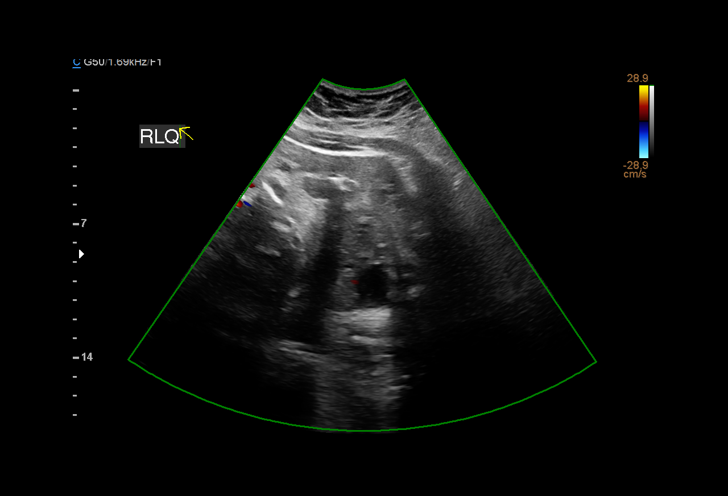
[im 13/15]
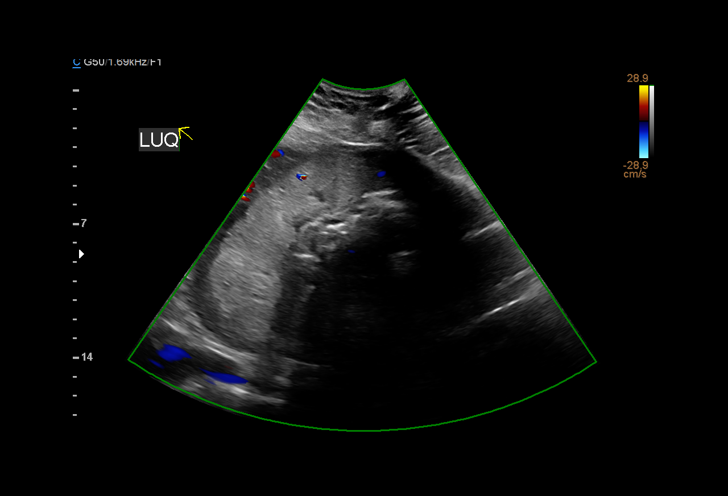
[im 14/15]
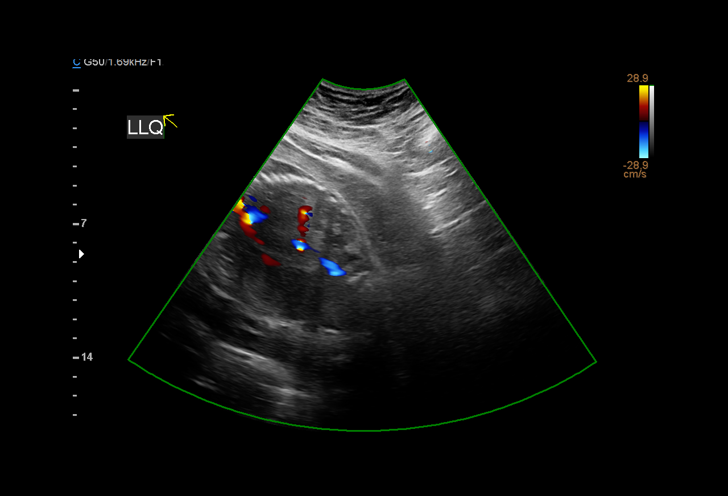
[im 15/15]
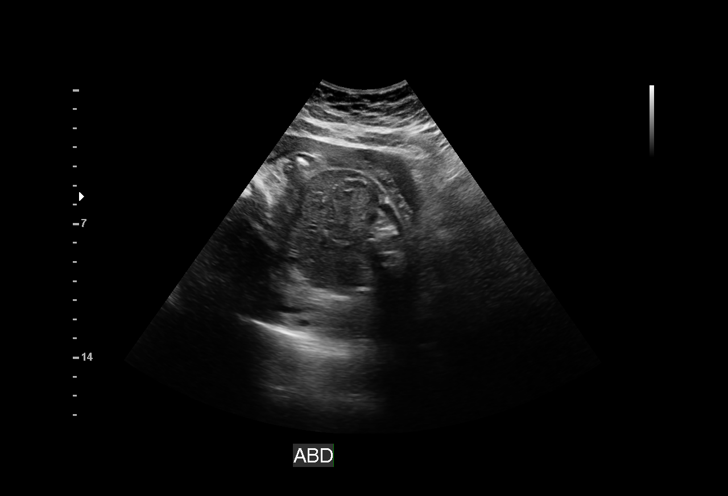

[14 of 15 positions shown; findings below may reference images not displayed]

[facility]
                                                             [facility]

  1  US MFM OB LIMITED                    76815.01     CLONY
                                                       FOLARANMI
 ----------------------------------------------------------------------

 ----------------------------------------------------------------------
Indications

  Premature rupture of membranes - leaking
  fluid (positive Fern 06/22/19; Anhydramnios)
  27 weeks gestation of pregnancy
  Cervical shortening complicating pregnancy
  (cerclage 05/04/19)
  Obesity complicating pregnancy, second
  trimester (Pre Pregnancy BMI 37.95)
  Gestational diabetes in pregnancy, diet
  controlled
  Hypertension - Chronic/Pre-existing
  (labatalol)
  Advanced maternal age primigravida 35+,
  second trimester( 38 yrs)
  Uterine fibroids
  Previous cervical surgery (cervical cone
  biopsy)
 ----------------------------------------------------------------------
Vital Signs

 BMI:
Fetal Evaluation

 Num Of Fetuses:          1
 Fetal Heart Rate(bpm):   159
 Cardiac Activity:        Observed
 Presentation:            Breech
 Placenta:                Posterior
 Amniotic Fluid
 AFI FV:      Anhydramnios
OB History

 Gravidity:    1
Gestational Age

 LMP:           27w 6d        Date:  12/28/18                 EDD:   10/04/19
 Best:          27w 6d     Det. By:  LMP  (12/28/18)          EDD:   10/04/19
Anatomy

 Bladder:               Appears normal
Cervix Uterus Adnexa

 Cervix
 Not visualized (advanced GA >92wks)

 Uterus
 Single fibroid noted, see table below.
Myomas

  Site                     L(cm)      W(cm)      D(cm)       Location
  Anterior                 5.1        5
 ----------------------------------------------------------------------

  Blood Flow                 RI        PI       Comments

 ----------------------------------------------------------------------
Impression

 Limited exam
 In patient PPROM
 Anyhydramnios
 Breech presentation
Recommendations

 Managment per inpatient provider.

## 2021-12-14 ENCOUNTER — Other Ambulatory Visit: Payer: Self-pay

## 2021-12-14 ENCOUNTER — Ambulatory Visit (INDEPENDENT_AMBULATORY_CARE_PROVIDER_SITE_OTHER): Payer: 59 | Admitting: Pulmonary Disease

## 2021-12-14 ENCOUNTER — Encounter: Payer: Self-pay | Admitting: Pulmonary Disease

## 2021-12-14 VITALS — BP 122/80 | HR 88 | Temp 98.2°F | Ht 66.5 in | Wt 260.4 lb

## 2021-12-14 DIAGNOSIS — R0683 Snoring: Secondary | ICD-10-CM

## 2021-12-14 NOTE — Patient Instructions (Signed)
Will arrange for home sleep study Will call to arrange for follow up after sleep study reviewed  

## 2021-12-14 NOTE — Progress Notes (Signed)
[facility]  Chief Complaint  Patient presents with   sleep consult    Pt states she has been known to snore at night.  States when she wakes up in the mornings, feels like she has not slept at all.    Past Surgical History:  She  has a past surgical history that includes Cervical cone biopsy (04-08-2008); Colposcopy (03-15-2008); Cervical cerclage (N/A, 05/04/2019); and Cesarean section (N/A, 07/11/2019).  Past Medical History:  Bell's palsy, Uterine fibroids, Gestational DM, Hirsutism, Hyperprolactinemia, HTN, Polycystic ovarian syndrome  Constitutional:  BP 122/80 (BP Location: Left Arm, Patient Position: Sitting, Cuff Size: Large)    Pulse 88    Temp 98.2 F (36.8 C) (Oral)    Ht 5' 6.5" (1.689 m)    Wt 260 lb 6.4 oz (118.1 kg)    SpO2 100% Comment: RA   BMI 41.40 kg/m   Brief Summary:  Janet Bowen is a 41 y.o. female former smoker with snoring.       Subjective:   She has know about her snoring for years.  This has been getting worse.  She wakes up hearing herself snore, and has been told that she stops breathing at night.  She has woken up with a globus sensation and feels like she is gagging.  She is always sleepy and tired during the day no matter how much she sleeps.  She has trouble staying focused when working on a computer.  She goes to sleep at 1130 pm.  She falls asleep quickly.  She wakes up 1 or 2 times to use the bathroom.  She gets out of bed at 830 am.  She feels tired in the morning.  She denies morning headache.  She does not use anything to help her fall sleep or stay awake.  She denies sleep walking, sleep talking, bruxism, or nightmares.  There is no history of restless legs.  She denies sleep hallucinations, sleep paralysis, or cataplexy.  The Epworth score is 7 out of 24.   Physical Exam:   Appearance - well kempt   ENMT - no sinus tenderness, no oral exudate, no LAN, Mallampati 3, low laying soft palate,  airway, no stridor  Respiratory - equal breath sounds bilaterally, no wheezing or rales  CV - s1s2 regular rate and rhythm, no murmurs  Ext - no clubbing, no edema  Skin - no rashes  Psych - normal mood and affect   Sleep Tests:    Social History:  She  reports that she quit smoking about 3 years ago. Her smoking use included cigarettes. She has a 4.00 pack-year smoking history. She has never used smokeless tobacco. She reports that she does not currently use drugs after having used the following drugs: Marijuana. She reports that she does not drink alcohol.  Family History:  Her family history includes Cerebral aneurysm in her sister; Diabetes in her maternal aunt; Hyperlipidemia in her father; Hypertension in her father; Multiple myeloma in her maternal grandmother.    Discussion:  She has snoring, sleep disruption, apnea, and daytime sleepiness.  She has history of hypertension and family history of sleep apnea (her aunt and cousin).  Her BMI is > 35.  I am concerned she could have obstructive sleep apnea.  Assessment/Plan:   Snoring with excessive daytime sleepiness. - will need to arrange for a home sleep study  Obesity. - discussed how weight can impact sleep and risk for sleep disordered breathing - discussed  options to assist with weight loss: combination of diet modification, cardiovascular and strength training exercises  Cardiovascular risk. - had an extensive discussion regarding the adverse health consequences related to untreated sleep disordered breathing - specifically discussed the risks for hypertension, coronary artery disease, cardiac dysrhythmias, cerebrovascular disease, and diabetes - lifestyle modification discussed  Safe driving practices. - discussed how sleep disruption can increase risk of accidents, particularly when driving - safe driving practices were discussed  Therapies for obstructive sleep apnea. - if the sleep study shows significant  sleep apnea, then various therapies for treatment were reviewed: CPAP, oral appliance, and surgical interventions  Time Spent Involved in Patient Care on Day of Examination:  37 minutes  Follow up:   Patient Instructions  Will arrange for home sleep study Will call to arrange for follow up after sleep study reviewed   Medication List:   Allergies as of 12/14/2021   No Known Allergies      Medication List        Accurate as of December 14, 2021 10:52 AM. If you have any questions, ask your nurse or doctor.          STOP taking these medications    multivitamin tablet Stopped by: Chesley Mires, MD       TAKE these medications    ASHWAGANDHA PO Take by mouth.   labetalol 300 MG tablet Commonly known as: NORMODYNE Take 1 tablet (300 mg total) by mouth 2 (two) times daily.        Signature:  Chesley Mires, MD [place] Pager - [phone] 12/14/2021, 10:52 AM

## 2022-01-18 LAB — HM PAP SMEAR
HM Pap smear: NEGATIVE
HPV, high-risk: NEGATIVE

## 2022-01-20 ENCOUNTER — Other Ambulatory Visit: Payer: Self-pay | Admitting: Internal Medicine

## 2022-02-15 ENCOUNTER — Ambulatory Visit: Payer: 59

## 2022-02-15 DIAGNOSIS — G4733 Obstructive sleep apnea (adult) (pediatric): Secondary | ICD-10-CM

## 2022-02-15 DIAGNOSIS — R0683 Snoring: Secondary | ICD-10-CM

## 2022-02-19 ENCOUNTER — Telehealth: Payer: Self-pay | Admitting: Pulmonary Disease

## 2022-02-19 DIAGNOSIS — G4733 Obstructive sleep apnea (adult) (pediatric): Secondary | ICD-10-CM | POA: Diagnosis not present

## 2022-02-19 NOTE — Telephone Encounter (Signed)
Called and went over results with patient and she voiced understanding. Scheduled an appt for [facility] office Friday 02/22/22. Nothing further needed.  ?

## 2022-02-19 NOTE — Telephone Encounter (Signed)
ATC patient.  LMTCB. 

## 2022-02-19 NOTE — Telephone Encounter (Signed)
HST 02/17/22 >> AHI 51.1, SpO2 low 76% ? ?Please inform her that her sleep study shows severe obstructive sleep apnea.  Please arrange for ROV with me or NP to discuss treatment options. ? ? ? ?

## 2022-02-22 ENCOUNTER — Encounter: Payer: Self-pay | Admitting: Nurse Practitioner

## 2022-02-22 ENCOUNTER — Ambulatory Visit (INDEPENDENT_AMBULATORY_CARE_PROVIDER_SITE_OTHER): Payer: 59 | Admitting: Nurse Practitioner

## 2022-02-22 VITALS — BP 118/80 | HR 91 | Ht 66.5 in | Wt 261.2 lb

## 2022-02-22 DIAGNOSIS — Z6841 Body Mass Index (BMI) 40.0 and over, adult: Secondary | ICD-10-CM | POA: Diagnosis not present

## 2022-02-22 DIAGNOSIS — G4733 Obstructive sleep apnea (adult) (pediatric): Secondary | ICD-10-CM | POA: Diagnosis not present

## 2022-02-22 NOTE — Progress Notes (Signed)
? ?'@Patient'$  ID: Janet Bowen, female    DOB: 09/06/1981, 41 y.o.   MRN: [id number] ? ?Chief Complaint  ?Patient presents with  ? Follow-up  ? ? ?Referring provider: ?Hoyt Koch, * ? ?HPI: ?41 year old female, former smoker followed for obstructive sleep apnea.  She is a patient Dr. Juanetta Gosling and last seen in office on 12/14/2021.  Past medical history significant for Bell's palsy, uterine fibroids, gestational DM, hirsutism, hyperprolactinemia, hypertension, PCOS. ? ?TEST/EVENTS:  ?02/17/2022 HST: AHI 51.1, SPO2 low 76%, average 91% ? ?12/14/2021: OV with Dr. Halford Chessman for consult regarding snoring and excessive daytime fatigue.  Concern for possible OSA given her weight and symptoms.  HST ordered for further evaluation. ? ?02/22/2022: Today-follow-up ?Patient presents today for follow-up to discuss home sleep study results which showed severe obstructive sleep apnea.  Continues to report snoring at night.  She has woken up a few times with globus sensation and feels like she is gagging.  Also has daytime fatigue symptoms.  Has trouble staying focused when working during the day.  Denies morning headaches, drowsy driving, sleepwalking, sleep talking, bruxism, nightmares.  She denies sleep loose Nations, sleep paralysis or cataplexy.  Breathing overall stable.  ? ?No Known Allergies ? ?Immunization History  ?Administered Date(s) Administered  ? Influenza,inj,Quad PF,6+ Mos 10/04/2020, 09/04/2021  ? PFIZER(Purple Top)SARS-COV-2 Vaccination 02/11/2020, 03/08/2020, 10/26/2020  ? Tdap 11/05/2011, 07/14/2019  ? ? ?Past Medical History:  ?Diagnosis Date  ? Bell's palsy   ? Fibroid   ? Gestational diabetes   ? Hirsutism   ? Hyperprolactinemia ([place])   ? Hypertension   ? Obesity   ? PCOS (polycystic ovarian syndrome)   ? Dr. Simona Huh (GYN)  ? ? ?Tobacco History: ?Social History  ? ?Tobacco Use  ?Smoking Status Former  ? Packs/day: 0.50  ? Years: 8.00  ? Pack years: 4.00  ? Types: Cigarettes  ? Quit date: 11/2018  ? Years  since quitting: 3.3  ?Smokeless Tobacco Never  ? ?Counseling given: Not Answered ? ? ?Outpatient Medications Prior to Visit  ?Medication Sig Dispense Refill  ? cholecalciferol (VITAMIN D) 25 MCG (1000 UNIT) tablet 1,000 Units.    ? labetalol (NORMODYNE) 300 MG tablet TAKE 1 TABLET(300 MG) BY MOUTH TWICE DAILY 60 tablet 0  ? ASHWAGANDHA PO Take by mouth. (Patient not taking: Reported on 02/22/2022)    ? ?No facility-administered medications prior to visit.  ? ? ? ?Review of Systems:  ? ?Constitutional: No weight loss or gain, night sweats, fevers, chills +fatigue ?HEENT: No headaches, difficulty swallowing, tooth/dental problems, or sore throat. No sneezing, itching, ear ache, nasal congestion, or post nasal drip. + Snoring ?CV:  No chest pain, orthopnea, PND, swelling in lower extremities, anasarca, dizziness, palpitations, syncope ?Resp: No shortness of breath with exertion or at rest. No excess mucus or change in color of mucus. No productive or non-productive. No hemoptysis. No wheezing.  No chest wall deformity ?GI:  No heartburn, indigestion, abdominal pain, nausea, vomiting, diarrhea, change in bowel habits, loss of appetite, bloody stools.  ?Skin: No rash, lesions, ulcerations ?Neuro: No dizziness or lightheadedness.  ?Psych: No depression or anxiety. Mood stable.  ? ? ? ?Physical Exam: ? ?BP 118/80 (BP Location: Right Arm, Cuff Size: Large)   Pulse 91   Ht 5' 6.5" (1.689 m)   Wt 261 lb 3.2 oz (118.5 kg)   SpO2 98%   BMI 41.53 kg/m?  ? ?GEN: Pleasant, interactive, well-appearing ; morbidly obese; in no acute distress. ?HEENT:  Normocephalic and  atraumatic. PERRLA. Sclera white. Nasal turbinates pink, moist and patent bilaterally. No rhinorrhea present. Oropharynx pink and moist, without exudate or edema. No lesions, ulcerations, or postnasal drip.  ?NECK:  Supple w/ fair ROM. No JVD present. Normal carotid impulses w/o bruits. Thyroid symmetrical with no goiter or nodules palpated. No lymphadenopathy.    ?CV: RRR, no m/r/g, no peripheral edema. Pulses intact, +2 bilaterally. No cyanosis, pallor or clubbing. ?PULMONARY:  Unlabored, regular breathing. Clear bilaterally A&P w/o wheezes/rales/rhonchi. No accessory muscle use. No dullness to percussion. ?GI: BS present and normoactive. Soft, non-tender to palpation.  ?MSK: No erythema, warmth or tenderness. Cap refil <2 sec all extrem. No deformities or joint swelling noted.  ?Neuro: A/Ox3. No focal deficits noted.   ?Skin: Warm, no lesions or rashe ?Psych: Normal affect and behavior. Judgement and thought content appropriate.  ? ? ? ?Lab Results: ? ?CBC ?   ?Component Value Date/Time  ? WBC 5.8 10/04/2020 0938  ? RBC 4.71 10/04/2020 0938  ? HGB 13.5 10/04/2020 0938  ? HCT 40.5 10/04/2020 0938  ? PLT 288.0 10/04/2020 0938  ? MCV 86.0 10/04/2020 0938  ? MCH 29.4 07/12/2019 0553  ? MCHC 33.4 10/04/2020 0938  ? RDW 13.2 10/04/2020 0938  ? LYMPHSABS 2.0 04/22/2019 2115  ? MONOABS 0.9 04/22/2019 2115  ? EOSABS 0.1 04/22/2019 2115  ? BASOSABS 0.0 04/22/2019 2115  ? ? ?BMET ?   ?Component Value Date/Time  ? NA 138 10/04/2020 0938  ? K 3.9 10/04/2020 0938  ? CL 105 10/04/2020 0938  ? CO2 27 10/04/2020 0938  ? GLUCOSE 108 (H) 10/04/2020 9470  ? BUN 12 10/04/2020 0938  ? CREATININE 0.76 10/04/2020 0938  ? CREATININE 0.73 01/27/2013 1128  ? CALCIUM 9.1 10/04/2020 0938  ? GFRNONAA >60 04/22/2019 2115  ? GFRAA >60 04/22/2019 2115  ? ? ?BNP ?No results found for: BNP ? ? ?Imaging: ? ?No results found. ? ? ? ?   ? View : No data to display.  ?  ?  ?  ? ? ?No results found for: NITRICOXIDE ? ? ? ? ? ?Assessment & Plan:  ? ?Severe obstructive sleep apnea ?AHI 51.1, SPO2 low 76%.  Advised that appropriate treatment for severe sleep apnea is CPAP therapy.  We did also discuss inspire device but informed her that her BMI would have to be below 35, ideally 30.  She is actively trying to lose weight.  She is open to starting on CPAP therapy.  We will start with auto set 10-20 cmH2O and mask  of choice.  Order sent to DME today.  ? ?Patient Instructions  ?Start CPAP auto 10-20 cmH2O every night, minimum of 4-6 hours a night.  ?Change equipment every 30 days or as directed by DME. Wash your tubing with warm soap and water daily, hang to dry. Wash humidifier portion weekly.  ?Be aware of reduced alertness and do not drive or operate heavy machinery if experiencing this or drowsiness.  ?Healthy weight management discussed.  ?Avoid or decrease alcohol consumption and medications that make you more sleepy, if possible. ?Notify if persistent daytime sleepiness occurs even with consistent use of CPAP. ? ?We discussed how untreated sleep apnea puts an individual at risk for cardiac arrhthymias, pulm HTN, DM, stroke and increases their risk for daytime accidents. ? ?Follow up in 8-12 weeks with Dr. Halford Chessman or Alanson Aly. If you have not been using your CPAP for a minimum of 31 days, please call to reschedule within 31-90 days of  starting. If symptoms do not improve or worsen, please contact office for sooner follow up or seek emergency care. ? ? ? ? ? ? ?Obesity ?Currently working on healthy weight management.  Encouraged continuation.   ? ? ?I spent 32 minutes of dedicated to the care of this patient on the date of this encounter to include pre-visit review of records, face-to-face time with the patient discussing conditions above, post visit ordering of testing, clinical documentation with the electronic health record, making appropriate referrals as documented, and communicating necessary findings to members of the patients care team. ? ?Clayton Bibles, NP ?02/22/2022 ? ?Pt aware and understands NP's role.  ? ?

## 2022-02-22 NOTE — Progress Notes (Signed)
Reviewed and agree with assessment/plan. ? ? ?Jerusha Reising, MD ?[facility] ?02/22/2022, 11:16 AM ?Pager:  [phone] ? ?

## 2022-02-22 NOTE — Assessment & Plan Note (Signed)
Currently working on healthy weight management.  Encouraged continuation.   ?

## 2022-02-22 NOTE — Assessment & Plan Note (Signed)
AHI 51.1, SPO2 low 76%.  Advised that appropriate treatment for severe sleep apnea is CPAP therapy.  We did also discuss inspire device but informed her that her BMI would have to be below 35, ideally 30.  She is actively trying to lose weight.  She is open to starting on CPAP therapy.  We will start with auto set 10-20 cmH2O and mask of choice.  Order sent to DME today.  ? ?Patient Instructions  ?Start CPAP auto 10-20 cmH2O every night, minimum of 4-6 hours a night.  ?Change equipment every 30 days or as directed by DME. Wash your tubing with warm soap and water daily, hang to dry. Wash humidifier portion weekly.  ?Be aware of reduced alertness and do not drive or operate heavy machinery if experiencing this or drowsiness.  ?Healthy weight management discussed.  ?Avoid or decrease alcohol consumption and medications that make you more sleepy, if possible. ?Notify if persistent daytime sleepiness occurs even with consistent use of CPAP. ? ?We discussed how untreated sleep apnea puts an individual at risk for cardiac arrhthymias, pulm HTN, DM, stroke and increases their risk for daytime accidents. ? ?Follow up in 8-12 weeks with Dr. Halford Chessman or Alanson Aly. If you have not been using your CPAP for a minimum of 31 days, please call to reschedule within 31-90 days of starting. If symptoms do not improve or worsen, please contact office for sooner follow up or seek emergency care. ? ? ? ? ? ?

## 2022-02-22 NOTE — Patient Instructions (Signed)
Start CPAP auto 10-20 cmH2O every night, minimum of 4-6 hours a night.  ?Change equipment every 30 days or as directed by DME. Wash your tubing with warm soap and water daily, hang to dry. Wash humidifier portion weekly.  ?Be aware of reduced alertness and do not drive or operate heavy machinery if experiencing this or drowsiness.  ?Healthy weight management discussed.  ?Avoid or decrease alcohol consumption and medications that make you more sleepy, if possible. ?Notify if persistent daytime sleepiness occurs even with consistent use of CPAP. ? ?We discussed how untreated sleep apnea puts an individual at risk for cardiac arrhthymias, pulm HTN, DM, stroke and increases their risk for daytime accidents. ? ?Follow up in 8-12 weeks with Dr. Halford Chessman or Alanson Aly. If you have not been using your CPAP for a minimum of 31 days, please call to reschedule within 31-90 days of starting. If symptoms do not improve or worsen, please contact office for sooner follow up or seek emergency care. ? ? ? ?

## 2022-02-27 ENCOUNTER — Other Ambulatory Visit: Payer: Self-pay | Admitting: Internal Medicine

## 2022-03-01 ENCOUNTER — Encounter: Payer: Self-pay | Admitting: Internal Medicine

## 2022-03-01 ENCOUNTER — Ambulatory Visit (INDEPENDENT_AMBULATORY_CARE_PROVIDER_SITE_OTHER): Payer: No Typology Code available for payment source | Admitting: Internal Medicine

## 2022-03-01 VITALS — BP 122/68 | HR 65 | Resp 18 | Ht 66.5 in | Wt 258.0 lb

## 2022-03-01 DIAGNOSIS — R7303 Prediabetes: Secondary | ICD-10-CM

## 2022-03-01 DIAGNOSIS — I10 Essential (primary) hypertension: Secondary | ICD-10-CM

## 2022-03-01 DIAGNOSIS — Z0000 Encounter for general adult medical examination without abnormal findings: Secondary | ICD-10-CM

## 2022-03-01 DIAGNOSIS — G4733 Obstructive sleep apnea (adult) (pediatric): Secondary | ICD-10-CM

## 2022-03-01 MED ORDER — LABETALOL HCL 300 MG PO TABS: 300.0000 mg | ORAL_TABLET | Freq: Two times a day (BID) | ORAL | 3 refills | Status: DC

## 2022-03-01 MED ORDER — VITAMIN D (ERGOCALCIFEROL) 1.25 MG (50000 UNIT) PO CAPS: 50000.0000 [IU] | ORAL_CAPSULE | ORAL | 0 refills | Status: DC

## 2022-03-01 NOTE — Assessment & Plan Note (Signed)
Flu shot counseled. Covid-19 counseled. Tetanus up to date. Pap smear up to date with gyn. Counseled about sun safety and mole surveillance. Counseled about the dangers of distracted driving. Given 10 year screening recommendations.  ? ?

## 2022-03-01 NOTE — Assessment & Plan Note (Signed)
Getting started on CPAP soon. ?

## 2022-03-01 NOTE — Assessment & Plan Note (Signed)
She is working on weight loss and is down some from prior.  ?

## 2022-03-01 NOTE — Assessment & Plan Note (Signed)
Reviewed recent BMP and stable. Taking labetalol 300 mg BID and refilled today. BP at goal. ?

## 2022-03-01 NOTE — Progress Notes (Signed)
? ?  Subjective:  ? ?Patient ID: KARLENE SOUTHARD, female    DOB: 1981/08/21, 41 y.o.   MRN: [id number] ? ?HPI ?The patient is a 41 YO female coming in for physical. ? ?PMH, [facility], social history reviewed ? ?Review of Systems  ?Constitutional: Negative.   ?HENT: Negative.    ?Eyes: Negative.   ?Respiratory:  Negative for cough, chest tightness and shortness of breath.   ?Cardiovascular:  Negative for chest pain, palpitations and leg swelling.  ?Gastrointestinal:  Negative for abdominal distention, abdominal pain, constipation, diarrhea, nausea and vomiting.  ?Musculoskeletal: Negative.   ?Skin: Negative.   ?Neurological: Negative.   ?Psychiatric/Behavioral: Negative.    ? ?Objective:  ?Physical Exam ?Constitutional:   ?   Appearance: She is well-developed. She is obese.  ?HENT:  ?   Head: Normocephalic and atraumatic.  ?Cardiovascular:  ?   Rate and Rhythm: Normal rate and regular rhythm.  ?Pulmonary:  ?   Effort: Pulmonary effort is normal. No respiratory distress.  ?   Breath sounds: Normal breath sounds. No wheezing or rales.  ?Abdominal:  ?   General: Bowel sounds are normal. There is no distension.  ?   Palpations: Abdomen is soft.  ?   Tenderness: There is no abdominal tenderness. There is no rebound.  ?Musculoskeletal:  ?   Cervical back: Normal range of motion.  ?Skin: ?   General: Skin is warm and dry.  ?Neurological:  ?   Mental Status: She is alert and oriented to person, place, and time.  ?   Coordination: Coordination normal.  ? ? ?Vitals:  ? 03/01/22 1542  ?BP: 122/68  ?Pulse: 65  ?Resp: 18  ?SpO2: 100%  ?Weight: 258 lb (117 kg)  ?Height: 5' 6.5" (1.689 m)  ? ? ?This visit occurred during the SARS-CoV-2 public health emergency.  Safety protocols were in place, including screening questions prior to the visit, additional usage of staff PPE, and extensive cleaning of exam room while observing appropriate contact time as indicated for disinfecting solutions.  ? ?Assessment & Plan:  ? ?

## 2022-03-01 NOTE — Patient Instructions (Signed)
We have sent in vitamin D to take 1 pill weekly to take for 3 months to restore the vitamin D levels.  ? ? ?

## 2022-03-01 NOTE — Assessment & Plan Note (Signed)
Reviewed recent HgA1c 6.2 and she is working on losing weight. Labs not indicated today. ?

## 2022-05-12 ENCOUNTER — Other Ambulatory Visit: Payer: Self-pay | Admitting: Internal Medicine

## 2022-05-17 ENCOUNTER — Ambulatory Visit (INDEPENDENT_AMBULATORY_CARE_PROVIDER_SITE_OTHER): Payer: No Typology Code available for payment source | Admitting: Nurse Practitioner

## 2022-05-17 ENCOUNTER — Encounter: Payer: Self-pay | Admitting: Nurse Practitioner

## 2022-05-17 VITALS — BP 122/78 | HR 74 | Temp 98.2°F | Ht 66.0 in | Wt 246.4 lb

## 2022-05-17 DIAGNOSIS — G4733 Obstructive sleep apnea (adult) (pediatric): Secondary | ICD-10-CM | POA: Diagnosis not present

## 2022-05-17 DIAGNOSIS — E669 Obesity, unspecified: Secondary | ICD-10-CM

## 2022-05-17 NOTE — Assessment & Plan Note (Signed)
Actively working on weight loss. Down 20 lb. Continue with weight loss measures.

## 2022-05-17 NOTE — Patient Instructions (Signed)
Increase usage of CPAP every night, minimum of 4-6 hours a night. We will adjust your pressures to 5-15 cmH2O Change equipment every 30 days or as directed by DME. Wash your tubing with warm soap and water daily, hang to dry. Wash humidifier portion weekly.  Be aware of reduced alertness and do not drive or operate heavy machinery if experiencing this or drowsiness.  Healthy weight management discussed.  Avoid or decrease alcohol consumption and medications that make you more sleepy, if possible. Notify if persistent daytime sleepiness occurs even with consistent use of CPAP.  Referral to Dr. Redmond Baseman with [facility] for Inspire device consultation Great job on weight loss measures. Keep up the good work!  Follow up in 4 weeks with Dr. Halford Chessman or Alanson Aly. If symptoms do not improve or worsen, please contact office for sooner follow up or seek emergency care.

## 2022-05-17 NOTE — Progress Notes (Signed)
$'@Patient'm$  ID: Janet Bowen, female    DOB: [birth year], 41 y.o.   MRN: [id number]  Chief Complaint  Patient presents with   Follow-up    Patient says she is having trouble with cpap. She can't sleep with it and she thinks they turned the pressure down on her cpap.     Referring provider: Hoyt Koch, *  HPI: 41 year old female, former smoker followed for obstructive sleep apnea.  She is a patient Dr. Juanetta Gosling and last seen in office on 02/22/2022.  Past medical history significant for Bell's palsy, uterine fibroids, gestational DM, hirsutism, hyperprolactinemia, hypertension, PCOS.  TEST/EVENTS:  02/17/2022 HST: AHI 51.1, SPO2 low 76%, average 91%  12/14/2021: OV with Dr. Halford Chessman for consult regarding snoring and excessive daytime fatigue.  Concern for possible OSA given her weight and symptoms.  HST ordered for further evaluation.  02/22/2022: OV with Jewelz Kobus NP for follow-up to discuss home sleep study results which showed severe obstructive sleep apnea.  Continues to report snoring at night.  She has woken up a few times with globus sensation and feels like she is gagging.  Also has daytime fatigue symptoms.  Has trouble staying focused when working during the day.  Denies morning headaches, drowsy driving, sleepwalking, sleep talking, bruxism, nightmares.  She denies sleep paralysis or cataplexy.  Breathing overall stable. Sleep study showed severe OSA. She was recommended to start CPAP therapy, which she was agreeable to. New start CPAP 10-20 cmH2O sent to DME.   05/17/2022: Today - follow up Patient presents today for follow up after starting on CPAP therapy. Unfortunately, she has been having trouble tolerating it. She never used to struggle with falling to sleep but ever since starting on therapy, she feels like she just lays there and can't seem to get her mind off the mask on her face. She also feels like the pressure is too high. She denies drowsy driving or morning headaches. She  has been working on weight loss and she's lost around 20 pounds so far. Her partner told her that she wasn't snoring as much as she used to now. She would like to see about the [place] device.   No Known Allergies  Immunization History  Administered Date(s) Administered   Influenza,inj,Quad PF,6+ Mos 10/04/2020, 09/04/2021   PFIZER(Purple Top)SARS-COV-2 Vaccination 02/11/2020, 03/08/2020, 10/26/2020   Tdap 11/05/2011, 07/14/2019    Past Medical History:  Diagnosis Date   Bell's palsy    Fibroid    Gestational diabetes    Hirsutism    Hyperprolactinemia (HCC)    Hypertension    Obesity    PCOS (polycystic ovarian syndrome)    Dr. Simona Huh (GYN)    Tobacco History: Social History   Tobacco Use  Smoking Status Former   Packs/day: 0.50   Years: 8.00   Total pack years: 4.00   Types: Cigarettes   Quit date: 11/2018   Years since quitting: 3.5  Smokeless Tobacco Never   Counseling given: Not Answered   Outpatient Medications Prior to Visit  Medication Sig Dispense Refill   cholecalciferol (VITAMIN D) 25 MCG (1000 UNIT) tablet 10,000 Units.     labetalol (NORMODYNE) 300 MG tablet Take 1 tablet (300 mg total) by mouth 2 (two) times daily. 180 tablet 3   Vitamin D, Ergocalciferol, (DRISDOL) 1.25 MG (50000 UNIT) CAPS capsule TAKE 1 CAPSULE BY MOUTH EVERY 7 DAYS 12 capsule 0   No facility-administered medications prior to visit.     Review of Systems:   Constitutional: No  weight loss or gain, night sweats, fevers, chills +fatigue HEENT: No headaches, difficulty swallowing, tooth/dental problems, or sore throat. No sneezing, itching, ear ache, nasal congestion, or post nasal drip. + Snoring CV:  No chest pain, orthopnea, PND, swelling in lower extremities, anasarca, dizziness, palpitations, syncope Resp: No shortness of breath with exertion or at rest. No excess mucus or change in color of mucus. No productive or non-productive. No hemoptysis. No wheezing.  No chest wall  deformity GI:  No heartburn, indigestion, abdominal pain, nausea, vomiting, diarrhea, change in bowel habits, loss of appetite, bloody stools.  Skin: No rash, lesions, ulcerations Neuro: No dizziness or lightheadedness.  Psych: No depression or anxiety. Mood stable.     Physical Exam:  BP 122/78 (BP Location: Right Arm, Patient Position: Sitting, Cuff Size: Large)   Pulse 74   Temp 98.2 F (36.8 C) (Oral)   Ht '5\' 6"'$  (1.676 m)   Wt 246 lb 6.4 oz (111.8 kg)   SpO2 100%   BMI 39.77 kg/m   GEN: Pleasant, interactive, well-appearing ; morbidly obese; in no acute distress. HEENT:  Normocephalic and atraumatic. PERRLA. Sclera white. Nasal turbinates pink, moist and patent bilaterally. No rhinorrhea present. Oropharynx pink and moist, without exudate or edema. No lesions, ulcerations, or postnasal drip.  NECK:  Supple w/ fair ROM. No JVD present. Normal carotid impulses w/o bruits. Thyroid symmetrical with no goiter or nodules palpated. No lymphadenopathy.   CV: RRR, no m/r/g, no peripheral edema. Pulses intact, +2 bilaterally. No cyanosis, pallor or clubbing. PULMONARY:  Unlabored, regular breathing. Clear bilaterally A&P w/o wheezes/rales/rhonchi. No accessory muscle use. No dullness to percussion. GI: BS present and normoactive. Soft, non-tender to palpation.  MSK: No erythema, warmth or tenderness. Cap refil <2 sec all extrem. No deformities or joint swelling noted.  Neuro: A/Ox3. No focal deficits noted.   Skin: Warm, no lesions or rashe Psych: Normal affect and behavior. Judgement and thought content appropriate.     Lab Results:  CBC    Component Value Date/Time   WBC 5.8 10/04/2020 0938   RBC 4.71 10/04/2020 0938   HGB 13.5 10/04/2020 0938   HCT 40.5 10/04/2020 0938   PLT 288.0 10/04/2020 0938   MCV 86.0 10/04/2020 0938   MCH 29.4 07/12/2019 0553   MCHC 33.4 10/04/2020 0938   RDW 13.2 10/04/2020 0938   LYMPHSABS 2.0 04/22/2019 2115   MONOABS 0.9 04/22/2019 2115    EOSABS 0.1 04/22/2019 2115   BASOSABS 0.0 04/22/2019 2115    BMET    Component Value Date/Time   NA 138 10/04/2020 0938   K 3.9 10/04/2020 0938   CL 105 10/04/2020 0938   CO2 27 10/04/2020 0938   GLUCOSE 108 (H) 10/04/2020 0938   BUN 12 10/04/2020 0938   CREATININE 0.76 10/04/2020 0938   CREATININE 0.73 01/27/2013 1128   CALCIUM 9.1 10/04/2020 0938   GFRNONAA >60 04/22/2019 2115   GFRAA >60 04/22/2019 2115    BNP No results found for: "BNP"   Imaging:  No results found.        No data to display          No results found for: "NITRICOXIDE"      Assessment & Plan:   Severe obstructive sleep apnea Struggling with tolerating CPAP. We are going to try to adjust her pressures down to 5-15 cmH2O and see if this helps. I also advised she try melatonin OTC 30 minutes prior to bedtime and see if this helps her with sleep  onset. She is willing to try these changes but she would like to be referred to ENT for [facility] device evaluation. We did discuss that her BMI may disqualify her; however, she is actively working on weight loss and hast lost 20 pounds so far so encouraged her to continue on this journey. We discussed how untreated sleep apnea puts an individual at risk for cardiac arrhthymias, pulm HTN, DM, stroke and increases their risk for daytime accidents.   Patient Instructions  Increase usage of CPAP every night, minimum of 4-6 hours a night. We will adjust your pressures to 5-15 cmH2O Change equipment every 30 days or as directed by DME. Wash your tubing with warm soap and water daily, hang to dry. Wash humidifier portion weekly.  Be aware of reduced alertness and do not drive or operate heavy machinery if experiencing this or drowsiness.  Healthy weight management discussed.  Avoid or decrease alcohol consumption and medications that make you more sleepy, if possible. Notify if persistent daytime sleepiness occurs even with consistent use of CPAP.  Referral to  Dr. Redmond Baseman with [facility] for [facility] device consultation Great job on weight loss measures. Keep up the good work!  Follow up in 4 weeks with Dr. Halford Chessman or Alanson Aly. If symptoms do not improve or worsen, please contact office for sooner follow up or seek emergency care.     Obesity (BMI 35.0-39.9 without comorbidity) Actively working on weight loss. Down 20 lb. Continue with weight loss measures.    I spent 32 minutes of dedicated to the care of this patient on the date of this encounter to include pre-visit review of records, face-to-face time with the patient discussing conditions above, post visit ordering of testing, clinical documentation with the electronic health record, making appropriate referrals as documented, and communicating necessary findings to members of the patients care team.  Clayton Bibles, NP 05/17/2022  Pt aware and understands NP's role.

## 2022-05-17 NOTE — Progress Notes (Signed)
Reviewed and agree with assessment/plan.   Chesley Mires, MD [facility] Pulmonary/Critical Care 05/17/2022, 1:40 PM Pager:  [phone]

## 2022-05-17 NOTE — Assessment & Plan Note (Signed)
Struggling with tolerating CPAP. We are going to try to adjust her pressures down to 5-15 cmH2O and see if this helps. I also advised she try melatonin OTC 30 minutes prior to bedtime and see if this helps her with sleep onset. She is willing to try these changes but she would like to be referred to ENT for [facility] device evaluation. We did discuss that her BMI may disqualify her; however, she is actively working on weight loss and hast lost 20 pounds so far so encouraged her to continue on this journey. We discussed how untreated sleep apnea puts an individual at risk for cardiac arrhthymias, pulm HTN, DM, stroke and increases their risk for daytime accidents.   Patient Instructions  Increase usage of CPAP every night, minimum of 4-6 hours a night. We will adjust your pressures to 5-15 cmH2O Change equipment every 30 days or as directed by DME. Wash your tubing with warm soap and water daily, hang to dry. Wash humidifier portion weekly.  Be aware of reduced alertness and do not drive or operate heavy machinery if experiencing this or drowsiness.  Healthy weight management discussed.  Avoid or decrease alcohol consumption and medications that make you more sleepy, if possible. Notify if persistent daytime sleepiness occurs even with consistent use of CPAP.  Referral to Dr. Redmond Baseman with [facility] for [facility] device consultation Great job on weight loss measures. Keep up the good work!  Follow up in 4 weeks with Dr. Halford Chessman or Alanson Aly. If symptoms do not improve or worsen, please contact office for sooner follow up or seek emergency care.

## 2022-06-14 ENCOUNTER — Encounter: Payer: Self-pay | Admitting: Nurse Practitioner

## 2022-06-14 ENCOUNTER — Ambulatory Visit (INDEPENDENT_AMBULATORY_CARE_PROVIDER_SITE_OTHER): Payer: No Typology Code available for payment source | Admitting: Nurse Practitioner

## 2022-06-14 VITALS — BP 118/68 | HR 83 | Temp 98.3°F | Ht 66.5 in | Wt 241.6 lb

## 2022-06-14 DIAGNOSIS — Z789 Other specified health status: Secondary | ICD-10-CM

## 2022-06-14 DIAGNOSIS — G4733 Obstructive sleep apnea (adult) (pediatric): Secondary | ICD-10-CM | POA: Diagnosis not present

## 2022-06-14 DIAGNOSIS — E669 Obesity, unspecified: Secondary | ICD-10-CM | POA: Diagnosis not present

## 2022-06-14 MED ORDER — TRAZODONE HCL 50 MG PO TABS: ORAL_TABLET | ORAL | 1 refills | Status: AC

## 2022-06-14 NOTE — Addendum Note (Signed)
Addended by: Irine Seal B on: 06/14/2022 03:31 PM   Modules accepted: Orders

## 2022-06-14 NOTE — Assessment & Plan Note (Signed)
She is still struggling with tolerating CPAP; does feel like pressures are better now with adjustment to 5-15. Discussed possible pharmacological therapy to help with adjustment to CPAP and sleep onset/duration. We will trial short term trazodone. She does have a three year old at home but always has someone else with her at night that could respond should she need anything. Advised to not drive after taking. Educated that she needs to apply her CPAP within 15 minutes of taking and avoid falling asleep without it as this could make her sleep apnea worse. We again discussed how untreated sleep apnea puts an individual at risk for cardiac arrhthymias, pulm HTN, DM, stroke and increases their risk for daytime accidents. She is going to work on compliance.  Previously referred to ENT. Has yet to schedule appt. Unsure if she will qualify with current BMI and AHI; however, she is down 20 pounds and is actively working on weight loss. Encouraged she continue with weight loss measures.   Patient Instructions  Increase usage of CPAP every night, minimum of 4-6 hours a night.  Change equipment every 30 days or as directed by DME. Wash your tubing with warm soap and water daily, hang to dry. Wash humidifier portion weekly.  Be aware of reduced alertness and do not drive or operate heavy machinery if experiencing this or drowsiness.  Healthy weight management discussed.  Avoid or decrease alcohol consumption and medications that make you more sleepy, if possible. Notify if persistent daytime sleepiness occurs even with consistent use of CPAP.  Orders sent for new strap - ensure you tighten after putting mask on  Trazodone - start with 1/2 tablet 30 minutes prior to bedtime. If you still have trouble falling asleep/sleeping, you can take 1 tablet on subsequent nights. Ensure you apply your CPAP within 15 minutes after taking and do not fall asleep without it on!    Great job on weight loss measures. Keep up the  good work!   Follow up in 6 weeks with Dr. Halford Chessman or Alanson Aly. If symptoms do not improve or worsen, please contact office for sooner follow up or seek emergency care.

## 2022-06-14 NOTE — Patient Instructions (Addendum)
Increase usage of CPAP every night, minimum of 4-6 hours a night.  Change equipment every 30 days or as directed by DME. Wash your tubing with warm soap and water daily, hang to dry. Wash humidifier portion weekly.  Be aware of reduced alertness and do not drive or operate heavy machinery if experiencing this or drowsiness.  Healthy weight management discussed.  Avoid or decrease alcohol consumption and medications that make you more sleepy, if possible. Notify if persistent daytime sleepiness occurs even with consistent use of CPAP.  Orders sent for new strap - ensure you tighten after putting mask on  Trazodone - start with 1/2 tablet 30 minutes prior to bedtime. If you still have trouble falling asleep/sleeping, you can take 1 tablet on subsequent nights. Ensure you apply your CPAP within 15 minutes after taking and do not fall asleep without it on!    Great job on weight loss measures. Keep up the good work!   Follow up in 6 weeks with Dr. Halford Chessman or Alanson Aly. If symptoms do not improve or worsen, please contact office for sooner follow up or seek emergency care.

## 2022-06-14 NOTE — Progress Notes (Signed)
$'@Patient'd$  ID: Janet Bowen, female    DOB: 18-Dec-1980, 41 y.o.   MRN: [id number]  Chief Complaint  Patient presents with   Follow-up    No new issues since LOV.    Referring provider: Hoyt Bowen, *  HPI: 41 year old female, former smoker followed for obstructive sleep apnea.  She is a patient Janet Bowen and last seen in office on 02/22/2022.  Past medical history significant for Bell's palsy, uterine fibroids, gestational DM, hirsutism, hyperprolactinemia, hypertension, PCOS.  TEST/EVENTS:  02/17/2022 HST: AHI 51.1, SPO2 low 76%, average 91%  12/14/2021: OV with Janet Bowen for consult regarding snoring and excessive daytime fatigue.  Concern for possible OSA given her weight and symptoms.  HST ordered for further evaluation.  02/22/2022: OV with Janet Jacober Bowen for follow-up to discuss home sleep study results which showed severe obstructive sleep apnea.  Continues to report snoring at night.  She has woken up a few times with globus sensation and feels like she is gagging.  Also has daytime fatigue symptoms.  Has trouble staying focused when working during the day.  Denies morning headaches, drowsy driving, sleepwalking, sleep talking, bruxism, nightmares.  She denies sleep paralysis or cataplexy.  Breathing overall stable. Sleep study showed severe OSA. She was recommended to start CPAP therapy, which she was agreeable to. New start CPAP 10-20 cmH2O sent to DME.   05/17/2022: OV with Janet Bowen. follow up after starting on CPAP therapy. Unfortunately, she has been having trouble tolerating it. She never used to struggle with falling to sleep but ever since starting on therapy, she feels like she just lays there and can't seem to get her mind off the mask on her face. She also feels like the pressure is too high. She denies drowsy driving or morning headaches. She has been working on weight loss and she's lost around 20 pounds so far. Her partner told her that she wasn't snoring as much as she  used to now. She would like to see about the [place] device. Adjusted pressures to 5-15 cmH2O. Encouraged increased compliance. Advised she try melatonin to help her fall asleep at night. Referral to ENT; although, given her severity, unsure if she will qualify.   06/14/2022: Today - follow up Patient presents today for follow up. At our last visit, she was having trouble adjusting to her CPAP. She felt like the pressures were too high and she was struggling to fall asleep with it on. We adjusted her pressures and suggested she try melatonin for sleep onset. Today, she reports that she feels like she is doing a little bit better with her machine. The pressure adjustment definitely helped and she doesn't feel like it's blowing her out. She still has trouble falling asleep or waking up after an hour and getting back to sleep with the CPAP on, so she finds herself taking it off. She also feels like sometimes it just falls off when she's sleeping; unsure if it's the strap or if she isn't adjusting it tight enough. She is still having daytime fatigue symptoms. Does feel like her snoring is better. Denies drowsy driving or morning headaches.   05/16/2022-06/14/2022 CPAP auto 5-15 Airview download 13/30 days used; 10%>4 hours; average usage 2 hours 20 minutes Pressure median 8.6, 95th 10.4 Leaks median 1.8, 95th 4.2 AHI 0.3  No Known Allergies  Immunization History  Administered Date(s) Administered   Influenza,inj,Quad PF,6+ Mos 10/04/2020, 09/04/2021   PFIZER(Purple Top)SARS-COV-2 Vaccination 02/11/2020, 03/08/2020, 10/26/2020   Tdap 11/05/2011, 07/14/2019  Past Medical History:  Diagnosis Date   Bell's palsy    Fibroid    Gestational diabetes    Hirsutism    Hyperprolactinemia (HCC)    Hypertension    Obesity    PCOS (polycystic ovarian syndrome)    Dr. Simona Huh (GYN)    Tobacco History: Social History   Tobacco Use  Smoking Status Former   Packs/day: 0.50   Years: 8.00   Total  pack years: 4.00   Types: Cigarettes   Quit date: 11/2018   Years since quitting: 3.6  Smokeless Tobacco Never   Counseling given: Not Answered   Outpatient Medications Prior to Visit  Medication Sig Dispense Refill   Berberine Chloride 500 MG CAPS 1 with largest meal     labetalol (NORMODYNE) 300 MG tablet Take 1 tablet (300 mg total) by mouth 2 (two) times daily. 180 tablet 3   Vitamin D, Ergocalciferol, (DRISDOL) 1.25 MG (50000 UNIT) CAPS capsule TAKE 1 CAPSULE BY MOUTH EVERY 7 DAYS 12 capsule 0   cholecalciferol (VITAMIN D) 25 MCG (1000 UNIT) tablet 10,000 Units.     No facility-administered medications prior to visit.     Review of Systems:   Constitutional: No weight loss or gain, night sweats, fevers, chills +fatigue HEENT: No headaches, difficulty swallowing, tooth/dental problems, or sore throat. No sneezing, itching, ear ache, nasal congestion, or post nasal drip. + Snoring CV:  No chest pain, orthopnea, PND, swelling in lower extremities, anasarca, dizziness, palpitations, syncope Resp: No shortness of breath with exertion or at rest. No excess mucus or change in color of mucus. No productive or non-productive. No hemoptysis. No wheezing.  No chest wall deformity GI:  No heartburn, indigestion, abdominal pain, nausea, vomiting, diarrhea, change in bowel habits, loss of appetite, bloody stools.  Skin: No rash, lesions, ulcerations Neuro: No dizziness or lightheadedness.  Psych: No depression or anxiety. Mood stable.     Physical Exam:  BP 118/68 (BP Location: Right Arm, Patient Position: Sitting, Cuff Size: Large)   Pulse 83   Temp 98.3 F (36.8 C) (Oral)   Ht 5' 6.5" (1.689 m)   Wt 241 lb 9.6 oz (109.6 kg)   SpO2 99%   BMI 38.41 kg/m   GEN: Pleasant, interactive, well-appearing ; morbidly obese; in no acute distress. HEENT:  Normocephalic and atraumatic. PERRLA. Sclera white. Nasal turbinates pink, moist and patent bilaterally. No rhinorrhea present.  Oropharynx pink and moist, without exudate or edema. No lesions, ulcerations, or postnasal drip.  NECK:  Supple w/ fair ROM. No JVD present. Normal carotid impulses w/o bruits. Thyroid symmetrical with no goiter or nodules palpated. No lymphadenopathy.   CV: RRR, no m/r/g, no peripheral edema. Pulses intact, +2 bilaterally. No cyanosis, pallor or clubbing. PULMONARY:  Unlabored, regular breathing. Clear bilaterally A&P w/o wheezes/rales/rhonchi. No accessory muscle use. No dullness to percussion. GI: BS present and normoactive. Soft, non-tender to palpation.  MSK: No erythema, warmth or tenderness. Cap refil <2 sec all extrem. No deformities or joint swelling noted.  Neuro: A/Ox3. No focal deficits noted.   Skin: Warm, no lesions or rashe Psych: Normal affect and behavior. Judgement and thought content appropriate.     Lab Results:  CBC    Component Value Date/Time   WBC 5.8 10/04/2020 0938   RBC 4.71 10/04/2020 0938   HGB 13.5 10/04/2020 0938   HCT 40.5 10/04/2020 0938   PLT 288.0 10/04/2020 0938   MCV 86.0 10/04/2020 0938   MCH 29.4 07/12/2019 0553   MCHC 33.4  10/04/2020 0938   RDW 13.2 10/04/2020 0938   LYMPHSABS 2.0 04/22/2019 2115   MONOABS 0.9 04/22/2019 2115   EOSABS 0.1 04/22/2019 2115   BASOSABS 0.0 04/22/2019 2115    BMET    Component Value Date/Time   NA 138 10/04/2020 0938   K 3.9 10/04/2020 0938   CL 105 10/04/2020 0938   CO2 27 10/04/2020 0938   GLUCOSE 108 (H) 10/04/2020 0938   BUN 12 10/04/2020 0938   CREATININE 0.76 10/04/2020 0938   CREATININE 0.73 01/27/2013 1128   CALCIUM 9.1 10/04/2020 0938   GFRNONAA >60 04/22/2019 2115   GFRAA >60 04/22/2019 2115    BNP No results found for: "BNP"   Imaging:  No results found.        No data to display          No results found for: "NITRICOXIDE"      Assessment & Plan:   Severe obstructive sleep apnea She is still struggling with tolerating CPAP; does feel like pressures are better now  with adjustment to 5-15. Discussed possible pharmacological therapy to help with adjustment to CPAP and sleep onset/duration. We will trial short term trazodone. She does have a three year old at home but always has someone else with her at night that could respond should she need anything. Advised to not drive after taking. Educated that she needs to apply her CPAP within 15 minutes of taking and avoid falling asleep without it as this could make her sleep apnea worse. We again discussed how untreated sleep apnea puts an individual at risk for cardiac arrhthymias, pulm HTN, DM, stroke and increases their risk for daytime accidents. She is going to work on compliance.  Previously referred to ENT. Has yet to schedule appt. Unsure if she will qualify with current BMI and AHI; however, she is down 20 pounds and is actively working on weight loss. Encouraged she continue with weight loss measures.   Patient Instructions  Increase usage of CPAP every night, minimum of 4-6 hours a night.  Change equipment every 30 days or as directed by DME. Wash your tubing with warm soap and water daily, hang to dry. Wash humidifier portion weekly.  Be aware of reduced alertness and do not drive or operate heavy machinery if experiencing this or drowsiness.  Healthy weight management discussed.  Avoid or decrease alcohol consumption and medications that make you more sleepy, if possible. Notify if persistent daytime sleepiness occurs even with consistent use of CPAP.  Orders sent for new strap - ensure you tighten after putting mask on  Trazodone - start with 1/2 tablet 30 minutes prior to bedtime. If you still have trouble falling asleep/sleeping, you can take 1 tablet on subsequent nights. Ensure you apply your CPAP within 15 minutes after taking and do not fall asleep without it on!    Great job on weight loss measures. Keep up the good work!   Follow up in 6 weeks with Janet Bowen or Alanson Aly. If symptoms do not  improve or worsen, please contact office for sooner follow up or seek emergency care.     Obesity (BMI 35.0-39.9 without comorbidity) Healthy weight management discussed. Encouraged continued weight loss measures.     I spent 32 minutes of dedicated to the care of this patient on the date of this encounter to include pre-visit review of records, face-to-face time with the patient discussing conditions above, post visit ordering of testing, clinical documentation with the electronic health record, making  appropriate referrals as documented, and communicating necessary findings to members of the patients care team.  Janet Bibles, Bowen 06/14/2022  Pt aware and understands Bowen's role.

## 2022-06-14 NOTE — Assessment & Plan Note (Signed)
Healthy weight management discussed. Encouraged continued weight loss measures.

## 2022-06-14 NOTE — Progress Notes (Signed)
Reviewed and agree with assessment/plan.   Chesley Mires, MD [facility] Pulmonary/Critical Care 06/14/2022, 3:14 PM Pager:  [phone]

## 2022-07-26 ENCOUNTER — Encounter: Payer: Self-pay | Admitting: Nurse Practitioner

## 2022-07-26 ENCOUNTER — Ambulatory Visit (INDEPENDENT_AMBULATORY_CARE_PROVIDER_SITE_OTHER): Payer: No Typology Code available for payment source | Admitting: Nurse Practitioner

## 2022-07-26 DIAGNOSIS — G4733 Obstructive sleep apnea (adult) (pediatric): Secondary | ICD-10-CM

## 2022-07-26 DIAGNOSIS — E669 Obesity, unspecified: Secondary | ICD-10-CM | POA: Diagnosis not present

## 2022-07-26 NOTE — Progress Notes (Signed)
$'@Patient'Q$  ID: Janet Bowen, female    DOB: [birth year], 41 y.o.   MRN: [id number]  Chief Complaint  Patient presents with   Follow-up    Referring provider: Hoyt Koch, *  HPI: 41 year old female, former smoker followed for obstructive sleep apnea.  She is a patient Dr. Juanetta Gosling and last seen in office on 8/11/20023 by [facility] NP.  Past medical history significant for Bell's palsy, uterine fibroids, gestational DM, hirsutism, hyperprolactinemia, hypertension, PCOS.  TEST/EVENTS:  02/17/2022 HST: AHI 51.1, SPO2 low 76%, average 91%  12/14/2021: OV with Dr. Halford Chessman for consult regarding snoring and excessive daytime fatigue.  Concern for possible OSA given her weight and symptoms.  HST ordered for further evaluation.  02/22/2022: OV with Avnoor Koury NP for follow-up to discuss home sleep study results which showed severe obstructive sleep apnea.  Continues to report snoring at night.  She has woken up a few times with globus sensation and feels like she is gagging.  Also has daytime fatigue symptoms.  Has trouble staying focused when working during the day.  Denies morning headaches, drowsy driving, sleepwalking, sleep talking, bruxism, nightmares.  She denies sleep paralysis or cataplexy.  Breathing overall stable. Sleep study showed severe OSA. She was recommended to start CPAP therapy, which she was agreeable to. New start CPAP 10-20 cmH2O sent to DME.   05/17/2022: OV with Esha Fincher NP. follow up after starting on CPAP therapy. Unfortunately, she has been having trouble tolerating it. She never used to struggle with falling to sleep but ever since starting on therapy, she feels like she just lays there and can't seem to get her mind off the mask on her face. She also feels like the pressure is too high. She denies drowsy driving or morning headaches. She has been working on weight loss and she's lost around 20 pounds so far. Her partner told her that she wasn't snoring as much as she used to now. She  would like to see about the [place] device. Adjusted pressures to 5-15 cmH2O. Encouraged increased compliance. Advised she try melatonin to help her fall asleep at night. Referral to ENT; although, given her severity, unsure if she will qualify.   06/14/2022: OV with Geno Sydnor NP for follow up. At our last visit, she was having trouble adjusting to her CPAP. She felt like the pressures were too high and she was struggling to fall asleep with it on. We adjusted her pressures and suggested she try melatonin for sleep onset. Today, she reports that she feels like she is doing a little bit better with her machine. The pressure adjustment definitely helped and she doesn't feel like it's blowing her out. She still has trouble falling asleep or waking up after an hour and getting back to sleep with the CPAP on, so she finds herself taking it off. She also feels like sometimes it just falls off when she's sleeping; unsure if it's the strap or if she isn't adjusting it tight enough. She is still having daytime fatigue symptoms. Does feel like her snoring is better. Denies drowsy driving or morning headaches. Download showed only 10% use >4 hr with average use 2 hr 20 min. Encouraged her to increase usage. Again discussed risks of untreated severe OSA. Rx trazodone to help her with sleep onset/maintenance and adjustment to CPAP. Instructed her to apply CPAP within 10 minutes of taking to avoid falling asleep without it. Orders sent for new strap.   07/26/2022: Today - follow up Patient presents today for  follow-up.  She has still been struggling with CPAP usage.  She has been trying to do a better job of wearing it nightly.  Sometimes she will fall asleep without it on.  She does feel like over the past week or so she has done better.  Trazodone has helped her adjust to falling and staying asleep with it.  She has been applying her CPAP after taking the trazodone, which has helped avoid falling asleep without it.  She receives  good benefit from use.  Wakes feeling more rested in the morning.  Denies any morning headaches or drowsy driving.  6/38/4665-9/93/5701 CPAP 5-15 Airview download 12/30 days; 27% >4 hr; average usage 4 hours 15 minutes Pressure median 7.3, 95th 10.6 Leaks median 0, 95th 4.4 AHI 0.2  No Known Allergies  Immunization History  Administered Date(s) Administered   Influenza,inj,Quad PF,6+ Mos 10/04/2020, 09/04/2021   PFIZER(Purple Top)SARS-COV-2 Vaccination 02/11/2020, 03/08/2020, 10/26/2020   Tdap 11/05/2011, 07/14/2019    Past Medical History:  Diagnosis Date   Bell's palsy    Fibroid    Gestational diabetes    Hirsutism    Hyperprolactinemia (HCC)    Hypertension    Obesity    PCOS (polycystic ovarian syndrome)    Dr. Simona Huh (GYN)    Tobacco History: Social History   Tobacco Use  Smoking Status Former   Packs/day: 0.50   Years: 8.00   Total pack years: 4.00   Types: Cigarettes   Quit date: 11/2018   Years since quitting: 3.7  Smokeless Tobacco Never   Counseling given: Not Answered   Outpatient Medications Prior to Visit  Medication Sig Dispense Refill   Berberine Chloride 500 MG CAPS 1 with largest meal     labetalol (NORMODYNE) 300 MG tablet Take 1 tablet (300 mg total) by mouth 2 (two) times daily. 180 tablet 3   traZODone (DESYREL) 50 MG tablet Take 1/2 tablet by mouth 30 minutes prior to bedtime. If you still have trouble falling asleep/sleeping, you can increase to 1 tab at bedtime. Ensure you apply your CPAP after taking. 30 tablet 1   Vitamin D, Ergocalciferol, (DRISDOL) 1.25 MG (50000 UNIT) CAPS capsule TAKE 1 CAPSULE BY MOUTH EVERY 7 DAYS 12 capsule 0   No facility-administered medications prior to visit.     Review of Systems:   Constitutional: No weight loss or gain, night sweats, fevers, chills +fatigue HEENT: No headaches, difficulty swallowing, tooth/dental problems, or sore throat. No sneezing, itching, ear ache, nasal congestion, or post  nasal drip. + Snoring (without CPAP) CV:  No chest pain, orthopnea, PND, swelling in lower extremities, anasarca, dizziness, palpitations, syncope Resp: No shortness of breath with exertion or at rest. No excess mucus or change in color of mucus. No productive or non-productive. No hemoptysis. No wheezing.  No chest wall deformity GI:  No heartburn, indigestion, abdominal pain, nausea, vomiting, diarrhea, change in bowel habits, loss of appetite, bloody stools.  Skin: No rash, lesions, ulcerations Neuro: No dizziness or lightheadedness.  Psych: No depression or anxiety. Mood stable.     Physical Exam:  BP (!) 130/90 (BP Location: Right Arm)   Pulse 70   Ht 5' 6.5" (1.689 m)   Wt 248 lb 3.2 oz (112.6 kg)   SpO2 98%   BMI 39.46 kg/m   GEN: Pleasant, interactive, well-appearing ; morbidly obese; in no acute distress. HEENT:  Normocephalic and atraumatic. PERRLA. Sclera white. Nasal turbinates pink, moist and patent bilaterally. No rhinorrhea present. Oropharynx pink and moist, without  exudate or edema. No lesions, ulcerations, or postnasal drip.  NECK:  Supple w/ fair ROM. No JVD present. Normal carotid impulses w/o bruits. Thyroid symmetrical with no goiter or nodules palpated. No lymphadenopathy.   CV: RRR, no m/r/g, no peripheral edema. Pulses intact, +2 bilaterally. No cyanosis, pallor or clubbing. PULMONARY:  Unlabored, regular breathing. Clear bilaterally A&P w/o wheezes/rales/rhonchi. No accessory muscle use. No dullness to percussion. GI: BS present and normoactive. Soft, non-tender to palpation.  MSK: No erythema, warmth or tenderness. Cap refil <2 sec all extrem. No deformities or joint swelling noted.  Neuro: A/Ox3. No focal deficits noted.   Skin: Warm, no lesions or rashe Psych: Normal affect and behavior. Judgement and thought content appropriate.     Lab Results:  CBC    Component Value Date/Time   WBC 5.8 10/04/2020 0938   RBC 4.71 10/04/2020 0938   HGB 13.5  10/04/2020 0938   HCT 40.5 10/04/2020 0938   PLT 288.0 10/04/2020 0938   MCV 86.0 10/04/2020 0938   MCH 29.4 07/12/2019 0553   MCHC 33.4 10/04/2020 0938   RDW 13.2 10/04/2020 0938   LYMPHSABS 2.0 04/22/2019 2115   MONOABS 0.9 04/22/2019 2115   EOSABS 0.1 04/22/2019 2115   BASOSABS 0.0 04/22/2019 2115    BMET    Component Value Date/Time   NA 138 10/04/2020 0938   K 3.9 10/04/2020 0938   CL 105 10/04/2020 0938   CO2 27 10/04/2020 0938   GLUCOSE 108 (H) 10/04/2020 0938   BUN 12 10/04/2020 0938   CREATININE 0.76 10/04/2020 0938   CREATININE 0.73 01/27/2013 1128   CALCIUM 9.1 10/04/2020 0938   GFRNONAA >60 04/22/2019 2115   GFRAA >60 04/22/2019 2115    BNP No results found for: "BNP"   Imaging:  No results found.        No data to display          No results found for: "NITRICOXIDE"      Assessment & Plan:   Severe obstructive sleep apnea Severe OSA with AHI 51.1.  She was started on CPAP therapy in June 2023.  She initially struggled with tolerating PAP machine.  We adjusted her pressures down to 5-15, which helped.  Also provided her with trazodone to use as needed to help her with sleep onset/maintenance, which she feels like has worked well.  She does feel like she is doing better with it.  Main issue is that she falls asleep without putting it on.  Reviewed measures to avoid this.  She will continue to work on increasing usage with goal greater than 4 to 6 hours a night.  We again discussed how untreated sleep apnea puts an individual at risk for cardiac arrhthymias, pulm HTN, DM, stroke and increases their risk for daytime accidents.   Patient Instructions  Increase CPAP usage to every night, minimum of 4-6 hours a night.  Change equipment every 30 days or as directed by DME. Wash your tubing with warm soap and water daily, hang to dry. Wash humidifier portion weekly.  Be aware of reduced alertness and do not drive or operate heavy machinery if  experiencing this or drowsiness.  Healthy weight management discussed.  Avoid or decrease alcohol consumption and medications that make you more sleepy, if possible.   Continue trazodone 1/2 tab-1 tab At bedtime as needed for sleep to help with CPAP. Apply CPAP within 10 minutes of taking to avoid falling asleep without it.   Follow up in 3 months  with Dr. Halford Chessman or Joellen Jersey Darean Rote,NP or sooner if needed      Obesity (BMI 35.0-39.9 without comorbidity) Healthy weight loss encouraged.      I spent 28 minutes of dedicated to the care of this patient on the date of this encounter to include pre-visit review of records, face-to-face time with the patient discussing conditions above, post visit ordering of testing, clinical documentation with the electronic health record, making appropriate referrals as documented, and communicating necessary findings to members of the patients care team.  Clayton Bibles, NP 07/26/2022  Pt aware and understands NP's role.

## 2022-07-26 NOTE — Assessment & Plan Note (Signed)
Healthy weight loss encouraged 

## 2022-07-26 NOTE — Assessment & Plan Note (Signed)
Severe OSA with AHI 51.1.  She was started on CPAP therapy in June 2023.  She initially struggled with tolerating PAP machine.  We adjusted her pressures down to 5-15, which helped.  Also provided her with trazodone to use as needed to help her with sleep onset/maintenance, which she feels like has worked well.  She does feel like she is doing better with it.  Main issue is that she falls asleep without putting it on.  Reviewed measures to avoid this.  She will continue to work on increasing usage with goal greater than 4 to 6 hours a night.  We again discussed how untreated sleep apnea puts an individual at risk for cardiac arrhthymias, pulm HTN, DM, stroke and increases their risk for daytime accidents.   Patient Instructions  Increase CPAP usage to every night, minimum of 4-6 hours a night.  Change equipment every 30 days or as directed by DME. Wash your tubing with warm soap and water daily, hang to dry. Wash humidifier portion weekly.  Be aware of reduced alertness and do not drive or operate heavy machinery if experiencing this or drowsiness.  Healthy weight management discussed.  Avoid or decrease alcohol consumption and medications that make you more sleepy, if possible.   Continue trazodone 1/2 tab-1 tab At bedtime as needed for sleep to help with CPAP. Apply CPAP within 10 minutes of taking to avoid falling asleep without it.   Follow up in 3 months with Dr. Halford Chessman or Alanson Aly or sooner if needed

## 2022-07-26 NOTE — Progress Notes (Signed)
Reviewed and agree with assessment/plan.   Chesley Mires, MD [facility] Pulmonary/Critical Care 07/26/2022, 12:01 PM Pager:  [phone]

## 2022-07-26 NOTE — Patient Instructions (Addendum)
Increase CPAP usage to every night, minimum of 4-6 hours a night.  Change equipment every 30 days or as directed by DME. Wash your tubing with warm soap and water daily, hang to dry. Wash humidifier portion weekly.  Be aware of reduced alertness and do not drive or operate heavy machinery if experiencing this or drowsiness.  Healthy weight management discussed.  Avoid or decrease alcohol consumption and medications that make you more sleepy, if possible.   Continue trazodone 1/2 tab-1 tab At bedtime as needed for sleep to help with CPAP. Apply CPAP within 10 minutes of taking to avoid falling asleep without it.   Follow up in 3 months with Dr. Halford Chessman or Alanson Aly or sooner if needed

## 2022-10-25 ENCOUNTER — Ambulatory Visit: Payer: 59 | Admitting: Nurse Practitioner

## 2022-11-01 ENCOUNTER — Ambulatory Visit (INDEPENDENT_AMBULATORY_CARE_PROVIDER_SITE_OTHER): Payer: 59 | Admitting: Nurse Practitioner

## 2022-11-01 ENCOUNTER — Encounter: Payer: Self-pay | Admitting: Nurse Practitioner

## 2022-11-01 VITALS — BP 130/88 | HR 85 | Ht 66.5 in | Wt 249.4 lb

## 2022-11-01 DIAGNOSIS — G4733 Obstructive sleep apnea (adult) (pediatric): Secondary | ICD-10-CM | POA: Diagnosis not present

## 2022-11-01 NOTE — Assessment & Plan Note (Addendum)
Continues to struggle with compliance. When she does wear her machine, she has excellent control and no significant leaks. Again reviewed risks of untreated severe including risk for cardiac arrhthymias, pulm HTN, DM, stroke and increases their risk for daytime accidents. Encouraged her on increased usage. She verbalized understanding. She would not be a candidate for hypoglossal nerve stimulator due to BMI 39. Oral appliance would not treat her appropriately and I'm not sure this would solve the compliance issue either. Cautioned on safe driving practices. Healthy weight loss encouraged.   Patient Instructions  Increase CPAP usage to every night, minimum of 4-6 hours a night.  Change equipment every 30 days or as directed by DME. Wash your tubing with warm soap and water daily, hang to dry. Wash humidifier portion weekly.  Be aware of reduced alertness and do not drive or operate heavy machinery if experiencing this or drowsiness.  Healthy weight management discussed.  Avoid or decrease alcohol consumption and medications that make you more sleepy, if possible.   Continue trazodone 1/2 tab-1 tab At bedtime as needed for sleep to help with CPAP. Apply CPAP within 10 minutes of taking to avoid falling asleep without it.   Follow up in 6 months with Dr. Halford Chessman or Alanson Aly or sooner if needed

## 2022-11-01 NOTE — Patient Instructions (Signed)
Increase CPAP usage to every night, minimum of 4-6 hours a night.  Change equipment every 30 days or as directed by DME. Wash your tubing with warm soap and water daily, hang to dry. Wash humidifier portion weekly.  Be aware of reduced alertness and do not drive or operate heavy machinery if experiencing this or drowsiness.  Healthy weight management discussed.  Avoid or decrease alcohol consumption and medications that make you more sleepy, if possible.   Continue trazodone 1/2 tab-1 tab At bedtime as needed for sleep to help with CPAP. Apply CPAP within 10 minutes of taking to avoid falling asleep without it.   Follow up in 6 months with Dr. Halford Chessman or Alanson Aly or sooner if needed

## 2022-11-01 NOTE — Progress Notes (Signed)
$'@Patient'w$  ID: Janet Bowen, female    DOB: 1981/04/30, 41 y.o.   MRN: [id number]  Chief Complaint  Patient presents with   Follow-up    Referring provider: Hoyt Koch, *  HPI: 41 year old female, former smoker followed for obstructive sleep apnea.  She is a patient Dr. Juanetta Gosling and last seen in office on 07/26/2022 by [facility] NP.  Past medical history significant for Bell's palsy, uterine fibroids, gestational DM, hirsutism, hyperprolactinemia, hypertension, PCOS.  TEST/EVENTS:  02/17/2022 HST: AHI 51.1, SPO2 low 76%, average 91%  12/14/2021: OV with Dr. Halford Chessman for consult regarding snoring and excessive daytime fatigue.  Concern for possible OSA given her weight and symptoms.  HST ordered for further evaluation.  02/22/2022: OV with Adonijah Baena NP for follow-up to discuss home sleep study results which showed severe obstructive sleep apnea.  Continues to report snoring at night.  She has woken up a few times with globus sensation and feels like she is gagging.  Also has daytime fatigue symptoms.  Has trouble staying focused when working during the day.  Denies morning headaches, drowsy driving, sleepwalking, sleep talking, bruxism, nightmares.  She denies sleep paralysis or cataplexy.  Breathing overall stable. Sleep study showed severe OSA. She was recommended to start CPAP therapy, which she was agreeable to. New start CPAP 10-20 cmH2O sent to DME.   05/17/2022: OV with Yaphet Smethurst NP. follow up after starting on CPAP therapy. Unfortunately, she has been having trouble tolerating it. She never used to struggle with falling to sleep but ever since starting on therapy, she feels like she just lays there and can't seem to get her mind off the mask on her face. She also feels like the pressure is too high. She denies drowsy driving or morning headaches. She has been working on weight loss and she's lost around 20 pounds so far. Her partner told her that she wasn't snoring as much as she used to now. She  would like to see about the [place] device. Adjusted pressures to 5-15 cmH2O. Encouraged increased compliance. Advised she try melatonin to help her fall asleep at night. Referral to ENT; although, given her severity, unsure if she will qualify.   06/14/2022: OV with Dylan Ruotolo NP for follow up. At our last visit, she was having trouble adjusting to her CPAP. She felt like the pressures were too high and she was struggling to fall asleep with it on. We adjusted her pressures and suggested she try melatonin for sleep onset. Today, she reports that she feels like she is doing a little bit better with her machine. The pressure adjustment definitely helped and she doesn't feel like it's blowing her out. She still has trouble falling asleep or waking up after an hour and getting back to sleep with the CPAP on, so she finds herself taking it off. She also feels like sometimes it just falls off when she's sleeping; unsure if it's the strap or if she isn't adjusting it tight enough. She is still having daytime fatigue symptoms. Does feel like her snoring is better. Denies drowsy driving or morning headaches. Download showed only 10% use >4 hr with average use 2 hr 20 min. Encouraged her to increase usage. Again discussed risks of untreated severe OSA. Rx trazodone to help her with sleep onset/maintenance and adjustment to CPAP. Instructed her to apply CPAP within 10 minutes of taking to avoid falling asleep without it. Orders sent for new strap.   07/26/2022: OV with Makylie Rivere NP for follow-up.  She  has still been struggling with CPAP usage.  She has been trying to do a better job of wearing it nightly.  Sometimes she will fall asleep without it on.  She does feel like over the past week or so she has done better.  Trazodone has helped her adjust to falling and staying asleep with it.  She has been applying her CPAP after taking the trazodone, which has helped avoid falling asleep without it.  She receives good benefit from use.   Wakes feeling more rested in the morning.  Denies any morning headaches or drowsy driving. CPAP 5-15 Airview download 12/30 days; 27% >4 hr; average usage 4 hours 15 minutes Pressure median 7.3, 95th 10.6 Leaks median 0, 95th 4.4 AHI 0.2  12 08/12/2022: Today-follow-up Patient presents today for follow-up for CPAP.  She has been feeling about the same since she was here last.  Still struggling with wearing her CPAP on a consistent basis.  She says that she tends to fall asleep without putting it on.  She does feel like she wakes feeling better rested when she wears it.  Does not feel as fatigued as she did when when initially for saw her.  Denies any morning headaches or drowsy driving.  10/01/2022-10/30/2022 CPAP auto 5-15 cmH2O 21/30 days used; 33%>4 hr; average usage 4 hours 6 minutes Pressure median 7.7, 95th 10.3 Leaks 95th 7.6 AHI 0  No Known Allergies  Immunization History  Administered Date(s) Administered   Influenza,inj,Quad PF,6+ Mos 10/04/2020, 09/04/2021   PFIZER(Purple Top)SARS-COV-2 Vaccination 02/11/2020, 03/08/2020, 10/26/2020   Tdap 11/05/2011, 07/14/2019    Past Medical History:  Diagnosis Date   Bell's palsy    Fibroid    Gestational diabetes    Hirsutism    Hyperprolactinemia (HCC)    Hypertension    Obesity    PCOS (polycystic ovarian syndrome)    Dr. Simona Huh (GYN)    Tobacco History: Social History   Tobacco Use  Smoking Status Former   Packs/day: 0.50   Years: 8.00   Total pack years: 4.00   Types: Cigarettes   Quit date: 11/2018   Years since quitting: 3.9  Smokeless Tobacco Never   Counseling given: Not Answered   Outpatient Medications Prior to Visit  Medication Sig Dispense Refill   Berberine Chloride 500 MG CAPS 1 with largest meal     labetalol (NORMODYNE) 300 MG tablet Take 1 tablet (300 mg total) by mouth 2 (two) times daily. 180 tablet 3   traZODone (DESYREL) 50 MG tablet Take 1/2 tablet by mouth 30 minutes prior to bedtime.  If you still have trouble falling asleep/sleeping, you can increase to 1 tab at bedtime. Ensure you apply your CPAP after taking. 30 tablet 1   Vitamin D, Ergocalciferol, (DRISDOL) 1.25 MG (50000 UNIT) CAPS capsule TAKE 1 CAPSULE BY MOUTH EVERY 7 DAYS 12 capsule 0   No facility-administered medications prior to visit.     Review of Systems:   Constitutional: No weight loss or gain, night sweats, fevers, chills +fatigue (improved) HEENT: No headaches, difficulty swallowing, tooth/dental problems, or sore throat. No sneezing, itching, ear ache, nasal congestion, or post nasal drip. + Snoring (without CPAP) CV:  No chest pain, orthopnea, PND, swelling in lower extremities, anasarca, dizziness, palpitations, syncope Resp: No shortness of breath with exertion or at rest. No excess mucus or change in color of mucus. No productive or non-productive. No hemoptysis. No wheezing.  No chest wall deformity GI:  No heartburn, indigestion, abdominal pain, nausea, vomiting,  diarrhea, change in bowel habits, loss of appetite, bloody stools.  Skin: No rash, lesions, ulcerations Neuro: No dizziness or lightheadedness.  Psych: No depression or anxiety. Mood stable.     Physical Exam:  BP 130/88 (BP Location: Left Arm)   Pulse 85   Ht 5' 6.5" (1.689 m)   Wt 249 lb 6.4 oz (113.1 kg)   SpO2 95%   BMI 39.65 kg/m   GEN: Pleasant, interactive, well-appearing ; morbidly obese; in no acute distress. HEENT:  Normocephalic and atraumatic. PERRLA. Sclera white. Nasal turbinates pink, moist and patent bilaterally. No rhinorrhea present. Oropharynx pink and moist, without exudate or edema. No lesions, ulcerations, or postnasal drip.  NECK:  Supple w/ fair ROM. No JVD present. Normal carotid impulses w/o bruits. Thyroid symmetrical with no goiter or nodules palpated. No lymphadenopathy.   CV: RRR, no m/r/g, no peripheral edema. Pulses intact, +2 bilaterally. No cyanosis, pallor or clubbing. PULMONARY:  Unlabored,  regular breathing. Clear bilaterally A&P w/o wheezes/rales/rhonchi. No accessory muscle use. No dullness to percussion. GI: BS present and normoactive. Soft, non-tender to palpation.  MSK: No erythema, warmth or tenderness. Cap refil <2 sec all extrem. No deformities or joint swelling noted.  Neuro: A/Ox3. No focal deficits noted.   Skin: Warm, no lesions or rashe Psych: Normal affect and behavior. Judgement and thought content appropriate.     Lab Results:  CBC    Component Value Date/Time   WBC 5.8 10/04/2020 0938   RBC 4.71 10/04/2020 0938   HGB 13.5 10/04/2020 0938   HCT 40.5 10/04/2020 0938   PLT 288.0 10/04/2020 0938   MCV 86.0 10/04/2020 0938   MCH 29.4 07/12/2019 0553   MCHC 33.4 10/04/2020 0938   RDW 13.2 10/04/2020 0938   LYMPHSABS 2.0 04/22/2019 2115   MONOABS 0.9 04/22/2019 2115   EOSABS 0.1 04/22/2019 2115   BASOSABS 0.0 04/22/2019 2115    BMET    Component Value Date/Time   NA 138 10/04/2020 0938   K 3.9 10/04/2020 0938   CL 105 10/04/2020 0938   CO2 27 10/04/2020 0938   GLUCOSE 108 (H) 10/04/2020 0938   BUN 12 10/04/2020 0938   CREATININE 0.76 10/04/2020 0938   CREATININE 0.73 01/27/2013 1128   CALCIUM 9.1 10/04/2020 0938   GFRNONAA >60 04/22/2019 2115   GFRAA >60 04/22/2019 2115    BNP No results found for: "BNP"   Imaging:  No results found.        No data to display          No results found for: "NITRICOXIDE"      Assessment & Plan:   Severe obstructive sleep apnea Continues to struggle with compliance. When she does wear her machine, she has excellent control and no significant leaks. Again reviewed risks of untreated severe including risk for cardiac arrhthymias, pulm HTN, DM, stroke and increases their risk for daytime accidents. Encouraged her on increased usage. She verbalized understanding. She would not be a candidate for hypoglossal nerve stimulator due to BMI 39. Oral appliance would not treat her appropriately and I'm  not sure this would solve the compliance issue either. Cautioned on safe driving practices. Healthy weight loss encouraged.   Patient Instructions  Increase CPAP usage to every night, minimum of 4-6 hours a night.  Change equipment every 30 days or as directed by DME. Wash your tubing with warm soap and water daily, hang to dry. Wash humidifier portion weekly.  Be aware of reduced alertness and do not drive  or operate heavy machinery if experiencing this or drowsiness.  Healthy weight management discussed.  Avoid or decrease alcohol consumption and medications that make you more sleepy, if possible.   Continue trazodone 1/2 tab-1 tab At bedtime as needed for sleep to help with CPAP. Apply CPAP within 10 minutes of taking to avoid falling asleep without it.   Follow up in 6 months with Dr. Halford Chessman or Alanson Aly or sooner if needed     I spent 28 minutes of dedicated to the care of this patient on the date of this encounter to include pre-visit review of records, face-to-face time with the patient discussing conditions above, post visit ordering of testing, clinical documentation with the electronic health record, making appropriate referrals as documented, and communicating necessary findings to members of the patients care team.  Clayton Bibles, NP 11/01/2022  Pt aware and understands NP's role.

## 2022-11-05 NOTE — Progress Notes (Signed)
Reviewed and agree with assessment/plan.   Chesley Mires, MD [facility] Pulmonary/Critical Care 11/05/2022, 8:06 AM Pager:  [phone]

## 2022-12-21 ENCOUNTER — Telehealth: Payer: No Typology Code available for payment source | Admitting: Family Medicine

## 2022-12-21 DIAGNOSIS — N3000 Acute cystitis without hematuria: Secondary | ICD-10-CM | POA: Diagnosis not present

## 2022-12-21 MED ORDER — SULFAMETHOXAZOLE-TRIMETHOPRIM 800-160 MG PO TABS: 1.0000 | ORAL_TABLET | Freq: Two times a day (BID) | ORAL | 0 refills | Status: AC

## 2022-12-21 NOTE — Progress Notes (Signed)
Virtual Visit Consent   Janet Bowen, you are scheduled for a virtual visit with a [facility] provider today. Just as with appointments in the office, your consent must be obtained to participate. Your consent will be active for this visit and any virtual visit you may have with one of our providers in the next 365 days. If you have a MyChart account, a copy of this consent can be sent to you electronically.  As this is a virtual visit, video technology does not allow for your provider to perform a traditional examination. This may limit your provider's ability to fully assess your condition. If your provider identifies any concerns that need to be evaluated in person or the need to arrange testing (such as labs, EKG, etc.), we will make arrangements to do so. Although advances in technology are sophisticated, we cannot ensure that it will always work on either your end or our end. If the connection with a video visit is poor, the visit may have to be switched to a telephone visit. With either a video or telephone visit, we are not always able to ensure that we have a secure connection.  By engaging in this virtual visit, you consent to the provision of healthcare and authorize for your insurance to be billed (if applicable) for the services provided during this visit. Depending on your insurance coverage, you may receive a charge related to this service.  I need to obtain your verbal consent now. Are you willing to proceed with your visit today? Janet Bowen has provided verbal consent on 12/21/2022 for a virtual visit (video or telephone). Dellia Nims, FNP  Date: 12/21/2022 5:15 PM  Virtual Visit via Video Note   I, Dellia Nims, connected with  Janet Bowen  ([id number], 06-23-1981) on 12/21/22 at  5:15 PM EST by a video-enabled telemedicine application and verified that I am speaking with the correct person using two identifiers.  Location: Patient: Virtual Visit Location Patient:  Home Provider: Virtual Visit Location Provider: Home Office   I discussed the limitations of evaluation and management by telemedicine and the availability of in person appointments. The patient expressed understanding and agreed to proceed.    History of Present Illness: Janet Bowen is a 42 y.o. who identifies as a female who was assigned female at birth, and is being seen today for urinary frequency, small amt of urine each void, no fever, no allergies. Marland Kitchen  HPI: HPI  Problems:  Patient Active Problem List   Diagnosis Date Noted   Severe obstructive sleep apnea 02/22/2022   Pre-diabetes 12/28/2020   Palpitations 10/04/2020   Essential hypertension 06/22/2019   Routine general medical examination at a health care facility 01/16/2018   Obesity (BMI 35.0-39.9 without comorbidity) 06/22/2015   PCOS (polycystic ovarian syndrome) 01/27/2013    Allergies: No Known Allergies Medications:  Current Outpatient Medications:    sulfamethoxazole-trimethoprim (BACTRIM DS) 800-160 MG tablet, Take 1 tablet by mouth 2 (two) times daily for 7 days., Disp: 14 tablet, Rfl: 0   Berberine Chloride 500 MG CAPS, 1 with largest meal, Disp: , Rfl:    labetalol (NORMODYNE) 300 MG tablet, Take 1 tablet (300 mg total) by mouth 2 (two) times daily., Disp: 180 tablet, Rfl: 3   traZODone (DESYREL) 50 MG tablet, Take 1/2 tablet by mouth 30 minutes prior to bedtime. If you still have trouble falling asleep/sleeping, you can increase to 1 tab at bedtime. Ensure you apply your CPAP after taking., Disp: 30 tablet,  Rfl: 1   Vitamin D, Ergocalciferol, (DRISDOL) 1.25 MG (50000 UNIT) CAPS capsule, TAKE 1 CAPSULE BY MOUTH EVERY 7 DAYS, Disp: 12 capsule, Rfl: 0  Observations/Objective: Patient is well-developed, well-nourished in no acute distress.  Resting comfortably  at home.  Head is normocephalic, atraumatic.  No labored breathing.  Speech is clear and coherent with logical content.  Patient is alert and oriented  at baseline.    Assessment and Plan: 1. Acute cystitis without hematuria  Increase fluids, preventative measures discussed, urgent care if sx worsen.   Follow Up Instructions: I discussed the assessment and treatment plan with the patient. The patient was provided an opportunity to ask questions and all were answered. The patient agreed with the plan and demonstrated an understanding of the instructions.  A copy of instructions were sent to the patient via MyChart unless otherwise noted below.     The patient was advised to call back or seek an in-person evaluation if the symptoms worsen or if the condition fails to improve as anticipated.  Time:  I spent 10 minutes with the patient via telehealth technology discussing the above problems/concerns.    Dellia Nims, FNP

## 2022-12-21 NOTE — Patient Instructions (Signed)
Urinary Tract Infection, Adult  A urinary tract infection (UTI) is an infection of any part of the urinary tract. The urinary tract includes the kidneys, ureters, bladder, and urethra. These organs make, store, and get rid of urine in the body. An upper UTI affects the ureters and kidneys. A lower UTI affects the bladder and urethra. What are the causes? Most urinary tract infections are caused by bacteria in your genital area around your urethra, where urine leaves your body. These bacteria grow and cause inflammation of your urinary tract. What increases the risk? You are more likely to develop this condition if: You have a urinary catheter that stays in place. You are not able to control when you urinate or have a bowel movement (incontinence). You are female and you: Use a spermicide or diaphragm for birth control. Have low estrogen levels. Are pregnant. You have certain genes that increase your risk. You are sexually active. You take antibiotic medicines. You have a condition that causes your flow of urine to slow down, such as: An enlarged prostate, if you are female. Blockage in your urethra. A kidney stone. A nerve condition that affects your bladder control (neurogenic bladder). Not getting enough to drink, or not urinating often. You have certain medical conditions, such as: Diabetes. A weak disease-fighting system (immunesystem). Sickle cell disease. Gout. Spinal cord injury. What are the signs or symptoms? Symptoms of this condition include: Needing to urinate right away (urgency). Frequent urination. This may include small amounts of urine each time you urinate. Pain or burning with urination. Blood in the urine. Urine that smells bad or unusual. Trouble urinating. Cloudy urine. Vaginal discharge, if you are female. Pain in the abdomen or the lower back. You may also have: Vomiting or a decreased appetite. Confusion. Irritability or tiredness. A fever or  chills. Diarrhea. The first symptom in older adults may be confusion. In some cases, they may not have any symptoms until the infection has worsened. How is this diagnosed? This condition is diagnosed based on your medical history and a physical exam. You may also have other tests, including: Urine tests. Blood tests. Tests for STIs (sexually transmitted infections). If you have had more than one UTI, a cystoscopy or imaging studies may be done to determine the cause of the infections. How is this treated? Treatment for this condition includes: Antibiotic medicine. Over-the-counter medicines to treat discomfort. Drinking enough water to stay hydrated. If you have frequent infections or have other conditions such as a kidney stone, you may need to see a health care provider who specializes in the urinary tract (urologist). In rare cases, urinary tract infections can cause sepsis. Sepsis is a life-threatening condition that occurs when the body responds to an infection. Sepsis is treated in the hospital with IV antibiotics, fluids, and other medicines. Follow these instructions at home:  Medicines Take over-the-counter and prescription medicines only as told by your health care provider. If you were prescribed an antibiotic medicine, take it as told by your health care provider. Do not stop using the antibiotic even if you start to feel better. General instructions Make sure you: Empty your bladder often and completely. Do not hold urine for long periods of time. Empty your bladder after sex. Wipe from front to back after urinating or having a bowel movement if you are female. Use each tissue only one time when you wipe. Drink enough fluid to keep your urine pale yellow. Keep all follow-up visits. This is important. Contact a health   care provider if: Your symptoms do not get better after 1-2 days. Your symptoms go away and then return. Get help right away if: You have severe pain in  your back or your lower abdomen. You have a fever or chills. You have nausea or vomiting. Summary A urinary tract infection (UTI) is an infection of any part of the urinary tract, which includes the kidneys, ureters, bladder, and urethra. Most urinary tract infections are caused by bacteria in your genital area. Treatment for this condition often includes antibiotic medicines. If you were prescribed an antibiotic medicine, take it as told by your health care provider. Do not stop using the antibiotic even if you start to feel better. Keep all follow-up visits. This is important. This information is not intended to replace advice given to you by your health care provider. Make sure you discuss any questions you have with your health care provider. Document Revised: 06/02/2020 Document Reviewed: 06/02/2020 Elsevier Patient Education  2023 [employer] Inc[employer]  

## 2023-02-24 ENCOUNTER — Other Ambulatory Visit: Payer: Self-pay | Admitting: Obstetrics and Gynecology

## 2023-02-24 DIAGNOSIS — Z1231 Encounter for screening mammogram for malignant neoplasm of breast: Secondary | ICD-10-CM

## 2023-02-26 ENCOUNTER — Ambulatory Visit
Admission: RE | Admit: 2023-02-26 | Discharge: 2023-02-26 | Disposition: A | Discharge status: Home / Self Care | Payer: No Typology Code available for payment source | Source: Ambulatory Visit | Attending: Obstetrics and Gynecology | Admitting: Obstetrics and Gynecology

## 2023-02-26 DIAGNOSIS — Z1231 Encounter for screening mammogram for malignant neoplasm of breast: Secondary | ICD-10-CM

## 2023-03-04 ENCOUNTER — Other Ambulatory Visit: Payer: Self-pay | Admitting: Internal Medicine

## 2023-04-15 ENCOUNTER — Other Ambulatory Visit: Payer: Self-pay | Admitting: Internal Medicine

## 2023-04-16 ENCOUNTER — Telehealth: Payer: Self-pay | Admitting: Internal Medicine

## 2023-04-16 NOTE — Telephone Encounter (Signed)
PT has not been seen since last year 02/2022 needs appointment before further refills

## 2023-04-16 NOTE — Telephone Encounter (Signed)
Prescription Request  04/16/2023  LOV: 03/01/2022  What is the name of the medication or equipment? labetalol (NORMODYNE) 300 MG tablet   Have you contacted your pharmacy to request a refill? No   Which pharmacy would you like this sent to?  [id number] [place], [place] - [address] AT [facility] [address] [place] [address] Phone: [phone] Fax: [phone]    Patient notified that their request is being sent to the clinical staff for review and that they should receive a response within 2 business days.   Please advise at Mobile [phone] (mobile)     Next OV is 04/22/2023. Patient said she will run out of above medication by 04/19/2023.

## 2023-04-22 ENCOUNTER — Ambulatory Visit (INDEPENDENT_AMBULATORY_CARE_PROVIDER_SITE_OTHER): Payer: No Typology Code available for payment source | Admitting: Internal Medicine

## 2023-04-22 ENCOUNTER — Encounter: Payer: Self-pay | Admitting: Internal Medicine

## 2023-04-22 VITALS — BP 160/118 | HR 74 | Temp 98.2°F | Ht 66.5 in | Wt 251.0 lb

## 2023-04-22 DIAGNOSIS — Z0000 Encounter for general adult medical examination without abnormal findings: Secondary | ICD-10-CM | POA: Diagnosis not present

## 2023-04-22 DIAGNOSIS — R7303 Prediabetes: Secondary | ICD-10-CM

## 2023-04-22 DIAGNOSIS — G4733 Obstructive sleep apnea (adult) (pediatric): Secondary | ICD-10-CM

## 2023-04-22 DIAGNOSIS — E559 Vitamin D deficiency, unspecified: Secondary | ICD-10-CM

## 2023-04-22 DIAGNOSIS — I10 Essential (primary) hypertension: Secondary | ICD-10-CM | POA: Diagnosis not present

## 2023-04-22 DIAGNOSIS — E282 Polycystic ovarian syndrome: Secondary | ICD-10-CM

## 2023-04-22 LAB — COMPREHENSIVE METABOLIC PANEL
ALT: 13 U/L (ref 0–35)
AST: 14 U/L (ref 0–37)
Albumin: 4 g/dL (ref 3.5–5.2)
Alkaline Phosphatase: 53 U/L (ref 39–117)
BUN: 12 mg/dL (ref 6–23)
CO2: 24 mEq/L (ref 19–32)
Calcium: 8.8 mg/dL (ref 8.4–10.5)
Chloride: 105 mEq/L (ref 96–112)
Creatinine, Ser: 0.87 mg/dL (ref 0.40–1.20)
GFR: 81.1 mL/min (ref >60.00)
Glucose, Bld: 115 mg/dL — ABNORMAL HIGH (ref 70–99)
Potassium: 4.2 mEq/L (ref 3.5–5.1)
Sodium: 137 mEq/L (ref 135–145)
Total Bilirubin: 0.2 mg/dL (ref 0.2–1.2)
Total Protein: 7.4 g/dL (ref 6.0–8.3)

## 2023-04-22 LAB — LIPID PANEL
Cholesterol: 142 mg/dL (ref 0–200)
HDL: 29.8 mg/dL — ABNORMAL LOW (ref >39.00)
LDL Cholesterol: 99 mg/dL (ref 0–99)
NonHDL: 111.72
Total CHOL/HDL Ratio: 5
Triglycerides: 65 mg/dL (ref 0.0–149.0)
VLDL: 13 mg/dL (ref 0.0–40.0)

## 2023-04-22 LAB — CBC
HCT: 40.3 % (ref 36.0–46.0)
Hemoglobin: 13 g/dL (ref 12.0–15.0)
MCHC: 32.3 g/dL (ref 30.0–36.0)
MCV: 86.3 fl (ref 78.0–100.0)
Platelets: 288 10*3/uL (ref 150.0–400.0)
RBC: 4.67 Mil/uL (ref 3.87–5.11)
RDW: 13.4 % (ref 11.5–15.5)
WBC: 6 10*3/uL (ref 4.0–10.5)

## 2023-04-22 LAB — VITAMIN D 25 HYDROXY (VIT D DEFICIENCY, FRACTURES): VITD: 51 ng/mL (ref 30.00–100.00)

## 2023-04-22 LAB — HEMOGLOBIN A1C: Hgb A1c MFr Bld: 6.1 % (ref 4.6–6.5)

## 2023-04-22 MED ORDER — LABETALOL HCL 300 MG PO TABS
300.0000 mg | ORAL_TABLET | Freq: Two times a day (BID) | ORAL | 3 refills | Status: DC
Start: 2023-04-22 — End: 2024-05-13

## 2023-04-22 MED ORDER — VITAMIN D (ERGOCALCIFEROL) 1.25 MG (50000 UNIT) PO CAPS: 50000.0000 [IU] | ORAL_CAPSULE | ORAL | 0 refills | Status: AC

## 2023-04-22 NOTE — Assessment & Plan Note (Signed)
BP above goal due to being off medication. Recent visits on medication at goal. Resume medication and if not at goal will adjust. Checking CMP and CBC and lipid panel today.

## 2023-04-22 NOTE — Assessment & Plan Note (Signed)
Using nightly and getting improvement in symptoms including less drowsiness.

## 2023-04-22 NOTE — Patient Instructions (Signed)
Call us if blood pressure does not go back down to normal once you are back on the medicine.  Think about a prenatal vitamin if you are not doing anything for birth control.

## 2023-04-22 NOTE — Progress Notes (Signed)
   Subjective:   Patient ID: Janet Bowen, female    DOB: [birth year], 42 y.o.   MRN: [id number]  HPI The patient is here for physical.  PMH, [facility], social history reviewed and updated  Review of Systems  Constitutional: Negative.   HENT: Negative.    Eyes: Negative.   Respiratory:  Negative for cough, chest tightness and shortness of breath.   Cardiovascular:  Negative for chest pain, palpitations and leg swelling.  Gastrointestinal:  Negative for abdominal distention, abdominal pain, constipation, diarrhea, nausea and vomiting.  Musculoskeletal: Negative.   Skin: Negative.   Neurological: Negative.   Psychiatric/Behavioral: Negative.      Objective:  Physical Exam Constitutional:      Appearance: She is well-developed. She is obese.  HENT:     Head: Normocephalic and atraumatic.  Cardiovascular:     Rate and Rhythm: Normal rate and regular rhythm.  Pulmonary:     Effort: Pulmonary effort is normal. No respiratory distress.     Breath sounds: Normal breath sounds. No wheezing or rales.  Abdominal:     General: Bowel sounds are normal. There is no distension.     Palpations: Abdomen is soft.     Tenderness: There is no abdominal tenderness. There is no rebound.  Musculoskeletal:     Cervical back: Normal range of motion.  Skin:    General: Skin is warm and dry.  Neurological:     Mental Status: She is alert and oriented to person, place, and time.     Coordination: Coordination normal.     Vitals:   04/22/23 0825 04/22/23 0828  BP: (!) 160/118 (!) 160/118  Pulse: 74   Temp: 98.2 F (36.8 C)   TempSrc: Oral   SpO2: 99%   Weight: 251 lb (113.9 kg)   Height: 5' 6.5" (1.689 m)     Assessment & Plan:

## 2023-04-22 NOTE — Assessment & Plan Note (Signed)
Checking HgA1c as no labs in some time.

## 2023-04-22 NOTE — Assessment & Plan Note (Signed)
Flu shot yearly. Tetanus up to date. Mammogram up to date with gyn, pap smear up to date with gyn. Counseled about sun safety and mole surveillance. Counseled about the dangers of distracted driving. Given 10 year screening recommendations.   

## 2023-04-22 NOTE — Assessment & Plan Note (Signed)
Checking vitamin D level. Refilled 50000 international units  weekly.

## 2023-04-22 NOTE — Assessment & Plan Note (Signed)
Checking HgA1c and lipid panel. She does have complication of pre-diabetes and hypertension and OSA and BMI 39.9. Counseled about weight and exercise/diet.

## 2023-04-22 NOTE — Assessment & Plan Note (Signed)
Having regular periods and no birth control method. Asked her to start prenatal vitamin in case of pregnancy and discussed options including vasectomy and condoms and hormonal and non-hormonal options. She does not want birth control at this time.

## 2023-07-20 ENCOUNTER — Other Ambulatory Visit: Payer: Self-pay | Admitting: Internal Medicine

## 2023-07-21 NOTE — Telephone Encounter (Signed)
Prescription Request  07/21/2023  LOV: 04/22/2023  What is the name of the medication or equipment? Vit D    Which pharmacy would you like this sent to?  [id number] [place], [place] - [address] AT [facility] OF [facility] & [address] [place] [address] Phone: [phone] Fax: [phone]    Sent to provider due to med. To review and that they should receive a response within 2 business days.   Last OV in [place] along with Vit level that was normal at 51.00

## 2023-08-28 ENCOUNTER — Other Ambulatory Visit: Payer: Self-pay | Admitting: Internal Medicine

## 2023-10-22 ENCOUNTER — Encounter: Payer: Self-pay | Admitting: Internal Medicine

## 2023-10-22 ENCOUNTER — Ambulatory Visit (INDEPENDENT_AMBULATORY_CARE_PROVIDER_SITE_OTHER): Payer: No Typology Code available for payment source | Admitting: Internal Medicine

## 2023-10-22 VITALS — BP 126/100 | HR 72 | Temp 98.0°F | Ht 66.5 in | Wt 250.0 lb

## 2023-10-22 DIAGNOSIS — R7303 Prediabetes: Secondary | ICD-10-CM | POA: Diagnosis not present

## 2023-10-22 DIAGNOSIS — I10 Essential (primary) hypertension: Secondary | ICD-10-CM

## 2023-10-22 DIAGNOSIS — Z6839 Body mass index (BMI) 39.0-39.9, adult: Secondary | ICD-10-CM

## 2023-10-22 MED ORDER — HYDROCHLOROTHIAZIDE 12.5 MG PO CAPS: 12.5000 mg | ORAL_CAPSULE | Freq: Every day | ORAL | 0 refills | Status: DC

## 2023-10-22 NOTE — Patient Instructions (Addendum)
We will add the blood pressure medicine hydrochlorothiazide to take 1 pill daily.  The sugar levels are 6.0 which is stable

## 2023-10-22 NOTE — Progress Notes (Unsigned)
   Subjective:   Patient ID: Janet Bowen, female    DOB: 02/18/1981, 42 y.o.   MRN: [id number]  HPI The patient is a 42 YO female coming in for medical management see A/P for details.   Review of Systems  Constitutional: Negative.   HENT: Negative.    Eyes: Negative.   Respiratory:  Negative for cough, chest tightness and shortness of breath.   Cardiovascular:  Negative for chest pain, palpitations and leg swelling.  Gastrointestinal:  Negative for abdominal distention, abdominal pain, constipation, diarrhea, nausea and vomiting.  Musculoskeletal: Negative.   Skin: Negative.   Neurological: Negative.   Psychiatric/Behavioral: Negative.      Objective:  Physical Exam Constitutional:      Appearance: She is well-developed.  HENT:     Head: Normocephalic and atraumatic.  Cardiovascular:     Rate and Rhythm: Normal rate and regular rhythm.  Pulmonary:     Effort: Pulmonary effort is normal. No respiratory distress.     Breath sounds: Normal breath sounds. No wheezing or rales.  Abdominal:     General: Bowel sounds are normal. There is no distension.     Palpations: Abdomen is soft.     Tenderness: There is no abdominal tenderness. There is no rebound.  Musculoskeletal:     Cervical back: Normal range of motion.  Skin:    General: Skin is warm and dry.  Neurological:     Mental Status: She is alert and oriented to person, place, and time.     Coordination: Coordination normal.     Vitals:   10/22/23 0851 10/22/23 0854  BP: (!) 126/100 (!) 126/100  Pulse: 72   Temp: 98 F (36.7 C)   TempSrc: Oral   SpO2: 99%   Weight: 250 lb (113.4 kg)   Height: 5' 6.5" (1.689 m)     Assessment & Plan:

## 2023-10-22 NOTE — Assessment & Plan Note (Signed)
POC Hga1c done during visit and stable. Encouraged continued diet/lifestyle changes.

## 2023-10-23 LAB — POCT GLYCOSYLATED HEMOGLOBIN (HGB A1C): HbA1c POC (<> result, manual entry): 6 % (ref 4.0–5.6)

## 2023-10-23 NOTE — Assessment & Plan Note (Signed)
She understands this contributes to pre-diabetes and hypertension and working on weight will help both conditions. Counseled about diet/exercise.

## 2023-10-23 NOTE — Assessment & Plan Note (Signed)
BP persistently high even despite being back on medication. Continue labetalol 300 mg BID and add hydrochlorothiazide 12.5 mg daily. Follow up 2-4 weeks for BMP and BP check. Adjust as needed then. Counseled about risk of CV disease from uncontrolled BP.

## 2023-11-07 ENCOUNTER — Ambulatory Visit: Payer: No Typology Code available for payment source | Admitting: Internal Medicine

## 2023-11-14 ENCOUNTER — Ambulatory Visit: Payer: No Typology Code available for payment source | Admitting: Internal Medicine

## 2023-11-21 ENCOUNTER — Ambulatory Visit (INDEPENDENT_AMBULATORY_CARE_PROVIDER_SITE_OTHER): Payer: No Typology Code available for payment source | Admitting: Internal Medicine

## 2023-11-21 ENCOUNTER — Encounter: Payer: Self-pay | Admitting: Internal Medicine

## 2023-11-21 VITALS — BP 124/88 | HR 102 | Temp 98.3°F | Ht 60.65 in | Wt 250.0 lb

## 2023-11-21 DIAGNOSIS — I10 Essential (primary) hypertension: Secondary | ICD-10-CM | POA: Diagnosis not present

## 2023-11-21 LAB — COMPREHENSIVE METABOLIC PANEL
ALT: 15 U/L (ref 0–35)
AST: 12 U/L (ref 0–37)
Albumin: 4.2 g/dL (ref 3.5–5.2)
Alkaline Phosphatase: 58 U/L (ref 39–117)
BUN: 14 mg/dL (ref 6–23)
CO2: 28 meq/L (ref 19–32)
Calcium: 9 mg/dL (ref 8.4–10.5)
Chloride: 104 meq/L (ref 96–112)
Creatinine, Ser: 0.81 mg/dL (ref 0.40–1.20)
GFR: 88 mL/min (ref >60.00)
Glucose, Bld: 107 mg/dL — ABNORMAL HIGH (ref 70–99)
Potassium: 3.8 meq/L (ref 3.5–5.1)
Sodium: 140 meq/L (ref 135–145)
Total Bilirubin: 0.4 mg/dL (ref 0.2–1.2)
Total Protein: 7.4 g/dL (ref 6.0–8.3)

## 2023-11-21 NOTE — Progress Notes (Signed)
   Subjective:   Patient ID: Janet Bowen, female    DOB: January 03, 1981, 43 y.o.   MRN: [id number]  HPI The patient is a 43 YO female coming in for BP follow up. Started hydrochlorothiazide 12.5 mg daily. No side effects and feels better.   Review of Systems  Constitutional: Negative.   HENT: Negative.    Eyes: Negative.   Respiratory:  Negative for cough, chest tightness and shortness of breath.   Cardiovascular:  Negative for chest pain, palpitations and leg swelling.  Gastrointestinal:  Negative for abdominal distention, abdominal pain, constipation, diarrhea, nausea and vomiting.  Musculoskeletal: Negative.   Skin: Negative.   Neurological: Negative.   Psychiatric/Behavioral: Negative.      Objective:  Physical Exam Constitutional:      Appearance: She is well-developed.  HENT:     Head: Normocephalic and atraumatic.  Cardiovascular:     Rate and Rhythm: Normal rate and regular rhythm.  Pulmonary:     Effort: Pulmonary effort is normal. No respiratory distress.     Breath sounds: Normal breath sounds. No wheezing or rales.  Abdominal:     General: Bowel sounds are normal. There is no distension.     Palpations: Abdomen is soft.     Tenderness: There is no abdominal tenderness. There is no rebound.  Musculoskeletal:     Cervical back: Normal range of motion.  Skin:    General: Skin is warm and dry.  Neurological:     Mental Status: She is alert and oriented to person, place, and time.     Coordination: Coordination normal.     Vitals:   11/21/23 0941  BP: 124/88  Pulse: (!) 102  Temp: 98.3 F (36.8 C)  TempSrc: Oral  SpO2: 97%  Weight: 250 lb (113.4 kg)  Height: 5' 0.65" (1.541 m)    Assessment & Plan:

## 2023-11-21 NOTE — Assessment & Plan Note (Signed)
BP now at goal on labetalol 300 mg BID and hydrochlorothiazide 12.5 mg daily. Checking CMP. Adjust as needed.

## 2024-01-16 ENCOUNTER — Other Ambulatory Visit: Payer: Self-pay | Admitting: Internal Medicine

## 2024-01-17 ENCOUNTER — Other Ambulatory Visit: Payer: Self-pay | Admitting: Internal Medicine

## 2024-01-20 ENCOUNTER — Other Ambulatory Visit: Payer: Self-pay | Admitting: Internal Medicine

## 2024-01-20 MED ORDER — HYDROCHLOROTHIAZIDE 12.5 MG PO CAPS: 12.5000 mg | ORAL_CAPSULE | Freq: Every day | ORAL | 0 refills | Status: DC

## 2024-04-19 ENCOUNTER — Other Ambulatory Visit: Payer: Self-pay | Admitting: Internal Medicine

## 2024-05-13 ENCOUNTER — Other Ambulatory Visit: Payer: Self-pay | Admitting: Internal Medicine

## 2024-07-20 ENCOUNTER — Other Ambulatory Visit: Payer: Self-pay | Admitting: Internal Medicine

## 2024-08-19 ENCOUNTER — Other Ambulatory Visit: Payer: Self-pay | Admitting: Internal Medicine

## 2024-10-20 ENCOUNTER — Other Ambulatory Visit: Payer: Self-pay | Admitting: Internal Medicine

## 2024-11-21 ENCOUNTER — Other Ambulatory Visit: Payer: Self-pay | Admitting: Internal Medicine
